# Patient Record
Sex: Male | Born: 1970 | Race: White | Hispanic: No | Marital: Single | Smoking: Current every day smoker
Health system: Southern US, Community
[De-identification: ages and names within clinical notes are randomized; demographics above are authoritative.]

## PROBLEM LIST (undated history)

## (undated) DIAGNOSIS — E669 Obesity, unspecified: Secondary | ICD-10-CM

## (undated) DIAGNOSIS — I10 Essential (primary) hypertension: Secondary | ICD-10-CM

## (undated) DIAGNOSIS — E569 Vitamin deficiency, unspecified: Secondary | ICD-10-CM

## (undated) DIAGNOSIS — G4733 Obstructive sleep apnea (adult) (pediatric): Secondary | ICD-10-CM

## (undated) HISTORY — DX: Vitamin deficiency, unspecified: E56.9

## (undated) HISTORY — DX: Obstructive sleep apnea (adult) (pediatric): G47.33

## (undated) HISTORY — DX: Obesity, unspecified: E66.9

---

## 2000-01-28 ENCOUNTER — Emergency Department (HOSPITAL_COMMUNITY): Admission: EM | Admit: 2000-01-28 | Discharge: 2000-01-28 | Payer: Self-pay | Admitting: Emergency Medicine

## 2015-10-28 HISTORY — PX: LAPAROSCOPIC GASTRIC BYPASS: SUR771

## 2017-09-25 DIAGNOSIS — G4733 Obstructive sleep apnea (adult) (pediatric): Secondary | ICD-10-CM | POA: Diagnosis not present

## 2017-10-21 DIAGNOSIS — E6609 Other obesity due to excess calories: Secondary | ICD-10-CM | POA: Diagnosis not present

## 2017-10-21 DIAGNOSIS — Z1389 Encounter for screening for other disorder: Secondary | ICD-10-CM | POA: Diagnosis not present

## 2017-10-21 DIAGNOSIS — R7309 Other abnormal glucose: Secondary | ICD-10-CM | POA: Diagnosis not present

## 2017-10-21 DIAGNOSIS — I10 Essential (primary) hypertension: Secondary | ICD-10-CM | POA: Diagnosis not present

## 2017-10-21 DIAGNOSIS — Z9884 Bariatric surgery status: Secondary | ICD-10-CM | POA: Diagnosis not present

## 2017-10-21 DIAGNOSIS — G4733 Obstructive sleep apnea (adult) (pediatric): Secondary | ICD-10-CM | POA: Diagnosis not present

## 2017-10-21 DIAGNOSIS — Z6833 Body mass index (BMI) 33.0-33.9, adult: Secondary | ICD-10-CM | POA: Diagnosis not present

## 2017-10-26 DIAGNOSIS — G4733 Obstructive sleep apnea (adult) (pediatric): Secondary | ICD-10-CM | POA: Diagnosis not present

## 2017-11-26 DIAGNOSIS — G4733 Obstructive sleep apnea (adult) (pediatric): Secondary | ICD-10-CM | POA: Diagnosis not present

## 2017-12-24 DIAGNOSIS — G4733 Obstructive sleep apnea (adult) (pediatric): Secondary | ICD-10-CM | POA: Diagnosis not present

## 2018-01-24 DIAGNOSIS — G4733 Obstructive sleep apnea (adult) (pediatric): Secondary | ICD-10-CM | POA: Diagnosis not present

## 2018-02-23 DIAGNOSIS — G4733 Obstructive sleep apnea (adult) (pediatric): Secondary | ICD-10-CM | POA: Diagnosis not present

## 2018-06-07 DIAGNOSIS — L299 Pruritus, unspecified: Secondary | ICD-10-CM | POA: Diagnosis not present

## 2018-06-07 DIAGNOSIS — Z1389 Encounter for screening for other disorder: Secondary | ICD-10-CM | POA: Diagnosis not present

## 2018-06-07 DIAGNOSIS — E6609 Other obesity due to excess calories: Secondary | ICD-10-CM | POA: Diagnosis not present

## 2018-06-07 DIAGNOSIS — Z6836 Body mass index (BMI) 36.0-36.9, adult: Secondary | ICD-10-CM | POA: Diagnosis not present

## 2018-06-07 DIAGNOSIS — R7309 Other abnormal glucose: Secondary | ICD-10-CM | POA: Diagnosis not present

## 2018-06-07 DIAGNOSIS — Z8639 Personal history of other endocrine, nutritional and metabolic disease: Secondary | ICD-10-CM | POA: Diagnosis not present

## 2018-06-07 DIAGNOSIS — R21 Rash and other nonspecific skin eruption: Secondary | ICD-10-CM | POA: Diagnosis not present

## 2018-09-06 DIAGNOSIS — Z713 Dietary counseling and surveillance: Secondary | ICD-10-CM | POA: Diagnosis not present

## 2018-09-06 DIAGNOSIS — Z6835 Body mass index (BMI) 35.0-35.9, adult: Secondary | ICD-10-CM | POA: Diagnosis not present

## 2018-09-06 DIAGNOSIS — Z9884 Bariatric surgery status: Secondary | ICD-10-CM | POA: Diagnosis not present

## 2018-09-06 DIAGNOSIS — E669 Obesity, unspecified: Secondary | ICD-10-CM | POA: Diagnosis not present

## 2018-09-14 DIAGNOSIS — G4733 Obstructive sleep apnea (adult) (pediatric): Secondary | ICD-10-CM | POA: Diagnosis not present

## 2018-09-14 DIAGNOSIS — I10 Essential (primary) hypertension: Secondary | ICD-10-CM | POA: Diagnosis not present

## 2018-09-14 DIAGNOSIS — E669 Obesity, unspecified: Secondary | ICD-10-CM | POA: Diagnosis not present

## 2018-09-14 DIAGNOSIS — Z6835 Body mass index (BMI) 35.0-35.9, adult: Secondary | ICD-10-CM | POA: Diagnosis not present

## 2018-09-21 DIAGNOSIS — R948 Abnormal results of function studies of other organs and systems: Secondary | ICD-10-CM | POA: Diagnosis not present

## 2018-09-21 DIAGNOSIS — E669 Obesity, unspecified: Secondary | ICD-10-CM | POA: Diagnosis not present

## 2018-09-21 DIAGNOSIS — Z6834 Body mass index (BMI) 34.0-34.9, adult: Secondary | ICD-10-CM | POA: Diagnosis not present

## 2018-09-27 DIAGNOSIS — J209 Acute bronchitis, unspecified: Secondary | ICD-10-CM | POA: Diagnosis not present

## 2018-09-27 DIAGNOSIS — E6609 Other obesity due to excess calories: Secondary | ICD-10-CM | POA: Diagnosis not present

## 2018-09-27 DIAGNOSIS — Z1389 Encounter for screening for other disorder: Secondary | ICD-10-CM | POA: Diagnosis not present

## 2018-09-27 DIAGNOSIS — Z6835 Body mass index (BMI) 35.0-35.9, adult: Secondary | ICD-10-CM | POA: Diagnosis not present

## 2018-09-27 DIAGNOSIS — J22 Unspecified acute lower respiratory infection: Secondary | ICD-10-CM | POA: Diagnosis not present

## 2018-11-15 DIAGNOSIS — Z9884 Bariatric surgery status: Secondary | ICD-10-CM | POA: Diagnosis not present

## 2018-11-15 DIAGNOSIS — Z1389 Encounter for screening for other disorder: Secondary | ICD-10-CM | POA: Diagnosis not present

## 2018-11-15 DIAGNOSIS — R7309 Other abnormal glucose: Secondary | ICD-10-CM | POA: Diagnosis not present

## 2018-11-15 DIAGNOSIS — G4733 Obstructive sleep apnea (adult) (pediatric): Secondary | ICD-10-CM | POA: Diagnosis not present

## 2018-11-15 DIAGNOSIS — I10 Essential (primary) hypertension: Secondary | ICD-10-CM | POA: Diagnosis not present

## 2018-11-15 DIAGNOSIS — E6609 Other obesity due to excess calories: Secondary | ICD-10-CM | POA: Diagnosis not present

## 2018-11-15 DIAGNOSIS — Z6835 Body mass index (BMI) 35.0-35.9, adult: Secondary | ICD-10-CM | POA: Diagnosis not present

## 2018-11-22 DIAGNOSIS — G4733 Obstructive sleep apnea (adult) (pediatric): Secondary | ICD-10-CM | POA: Diagnosis not present

## 2018-12-13 DIAGNOSIS — Z9884 Bariatric surgery status: Secondary | ICD-10-CM | POA: Diagnosis not present

## 2019-05-12 DIAGNOSIS — G4733 Obstructive sleep apnea (adult) (pediatric): Secondary | ICD-10-CM | POA: Diagnosis not present

## 2019-09-14 DIAGNOSIS — Z6836 Body mass index (BMI) 36.0-36.9, adult: Secondary | ICD-10-CM | POA: Diagnosis not present

## 2019-09-14 DIAGNOSIS — I10 Essential (primary) hypertension: Secondary | ICD-10-CM | POA: Diagnosis not present

## 2019-09-14 DIAGNOSIS — G4733 Obstructive sleep apnea (adult) (pediatric): Secondary | ICD-10-CM | POA: Diagnosis not present

## 2020-03-06 DIAGNOSIS — G4733 Obstructive sleep apnea (adult) (pediatric): Secondary | ICD-10-CM | POA: Diagnosis not present

## 2020-08-07 DIAGNOSIS — G4733 Obstructive sleep apnea (adult) (pediatric): Secondary | ICD-10-CM | POA: Diagnosis not present

## 2020-08-16 DIAGNOSIS — Z23 Encounter for immunization: Secondary | ICD-10-CM | POA: Diagnosis not present

## 2020-08-16 DIAGNOSIS — Z6841 Body Mass Index (BMI) 40.0 and over, adult: Secondary | ICD-10-CM | POA: Diagnosis not present

## 2020-08-16 DIAGNOSIS — G4733 Obstructive sleep apnea (adult) (pediatric): Secondary | ICD-10-CM | POA: Diagnosis not present

## 2020-08-16 DIAGNOSIS — E7849 Other hyperlipidemia: Secondary | ICD-10-CM | POA: Diagnosis not present

## 2020-08-16 DIAGNOSIS — Z9884 Bariatric surgery status: Secondary | ICD-10-CM | POA: Diagnosis not present

## 2020-08-16 DIAGNOSIS — Z0001 Encounter for general adult medical examination with abnormal findings: Secondary | ICD-10-CM | POA: Diagnosis not present

## 2020-08-16 DIAGNOSIS — Z1331 Encounter for screening for depression: Secondary | ICD-10-CM | POA: Diagnosis not present

## 2020-08-16 DIAGNOSIS — I10 Essential (primary) hypertension: Secondary | ICD-10-CM | POA: Diagnosis not present

## 2020-08-16 DIAGNOSIS — Z1389 Encounter for screening for other disorder: Secondary | ICD-10-CM | POA: Diagnosis not present

## 2020-08-16 DIAGNOSIS — R7309 Other abnormal glucose: Secondary | ICD-10-CM | POA: Diagnosis not present

## 2020-09-30 NOTE — Progress Notes (Deleted)
Cardiology Office Note:    Date:  09/30/2020   ID:  Bruce Cruz, DOB Nov 18, 1970, MRN [id number]  PCP:  No primary care provider on file.  [facility] Cardiologist:  No primary care provider on file.  [facility] Electrophysiologist:  None   Referring MD: Assunta Found, MD     History of Present Illness:    Bruce Cruz is a 49 y.o. male with a hx of morbid obesity s/p Roux-en Y, GERD and HTN who was referred by Dr. Phillips Odor for management of his hypertension.  No past medical history on file.  *** The histories are not reviewed yet. Please review them in the "History" navigator section and refresh this SmartLink.  Current Medications: No outpatient medications have been marked as taking for the 10/02/20 encounter (Appointment) with Meriam Sprague, MD.     Allergies:   Patient has no allergy information on record.   Social History   Socioeconomic History  . Marital status: Single    Spouse name: Not on file  . Number of children: Not on file  . Years of education: Not on file  . Highest education level: Not on file  Occupational History  . Not on file  Tobacco Use  . Smoking status: Not on file  Substance and Sexual Activity  . Alcohol use: Not on file  . Drug use: Not on file  . Sexual activity: Not on file  Other Topics Concern  . Not on file  Social History Narrative  . Not on file   Social Determinants of Health   Financial Resource Strain:   . Difficulty of Paying Living Expenses: Not on file  Food Insecurity:   . Worried About Programme researcher, broadcasting/film/video in the Last Year: Not on file  . Ran Out of Food in the Last Year: Not on file  Transportation Needs:   . Lack of Transportation (Medical): Not on file  . Lack of Transportation (Non-Medical): Not on file  Physical Activity:   . Days of Exercise per Week: Not on file  . Minutes of Exercise per Session: Not on file  Stress:   . Feeling of Stress : Not on file  Social Connections:   .  Frequency of Communication with Friends and Family: Not on file  . Frequency of Social Gatherings with Friends and Family: Not on file  . Attends Religious Services: Not on file  . Active Member of Clubs or Organizations: Not on file  . Attends Banker Meetings: Not on file  . Marital Status: Not on file     Family History: The patient's ***family history is not on file.  ROS:   Please see the history of present illness.    *** All other systems reviewed and are negative.  EKGs/Labs/Other Studies Reviewed:    The following studies were reviewed today: Myoview 2017: No inducible ischemia; normal LV function  EKG:  EKG is *** ordered today.  The ekg ordered today demonstrates ***  Recent Labs: No results found for requested labs within last 8760 hours.  Recent Lipid Panel No results found for: CHOL, TRIG, HDL, CHOLHDL, VLDL, LDLCALC, LDLDIRECT   Risk Assessment/Calculations:   {Does this patient have ATRIAL FIBRILLATION?:[id number]}   Physical Exam:    VS:  There were no vitals taken for this visit.    Wt Readings from Last 3 Encounters:  No data found for Wt     GEN: *** Well nourished, well developed in no acute distress  HEENT: Normal NECK: No JVD; No carotid bruits LYMPHATICS: No lymphadenopathy CARDIAC: ***RRR, no murmurs, rubs, gallops RESPIRATORY:  Clear to auscultation without rales, wheezing or rhonchi  ABDOMEN: Soft, non-tender, non-distended MUSCULOSKELETAL:  No edema; No deformity  SKIN: Warm and dry NEUROLOGIC:  Alert and oriented x 3 PSYCHIATRIC:  Normal affect   ASSESSMENT:    No diagnosis found. PLAN:    In order of problems listed above:  #HTN:  #Morbid Obesity    Shared Decision Making/Informed Consent   {Are you ordering a CV Procedure (e.g. stress test, cath, DCCV, TEE, etc)?   Press F2        :[id number]}    Medication Adjustments/Labs and Tests Ordered: Current medicines are reviewed at length with the patient today.   Concerns regarding medicines are outlined above.  No orders of the defined types were placed in this encounter.  No orders of the defined types were placed in this encounter.   There are no Patient Instructions on file for this visit.   Signed, Meriam Sprague, MD  09/30/2020 8:40 AM    [facility]

## 2020-10-02 ENCOUNTER — Ambulatory Visit: Payer: Self-pay | Admitting: Cardiology

## 2020-10-22 ENCOUNTER — Emergency Department (HOSPITAL_COMMUNITY): Payer: Worker's Compensation

## 2020-10-22 ENCOUNTER — Other Ambulatory Visit: Payer: Self-pay

## 2020-10-22 ENCOUNTER — Other Ambulatory Visit: Payer: Self-pay | Admitting: Student

## 2020-10-22 ENCOUNTER — Encounter (HOSPITAL_COMMUNITY): Payer: Self-pay

## 2020-10-22 ENCOUNTER — Emergency Department (HOSPITAL_COMMUNITY)
Admission: EM | Admit: 2020-10-22 | Discharge: 2020-10-22 | Disposition: A | Discharge status: Home / Self Care | Payer: Worker's Compensation | Attending: Emergency Medicine | Admitting: Emergency Medicine

## 2020-10-22 DIAGNOSIS — L03116 Cellulitis of left lower limb: Secondary | ICD-10-CM | POA: Diagnosis not present

## 2020-10-22 DIAGNOSIS — Z4800 Encounter for change or removal of nonsurgical wound dressing: Secondary | ICD-10-CM | POA: Diagnosis not present

## 2020-10-22 DIAGNOSIS — I10 Essential (primary) hypertension: Secondary | ICD-10-CM | POA: Insufficient documentation

## 2020-10-22 DIAGNOSIS — Z20822 Contact with and (suspected) exposure to covid-19: Secondary | ICD-10-CM | POA: Diagnosis not present

## 2020-10-22 DIAGNOSIS — L0390 Cellulitis, unspecified: Secondary | ICD-10-CM

## 2020-10-22 HISTORY — DX: Essential (primary) hypertension: I10

## 2020-10-22 LAB — COMPREHENSIVE METABOLIC PANEL
ALT: 19 U/L (ref 0–44)
AST: 19 U/L (ref 15–41)
Albumin: 3.9 g/dL (ref 3.5–5.0)
Alkaline Phosphatase: 76 U/L (ref 38–126)
Anion gap: 10 (ref 5–15)
BUN: 15 mg/dL (ref 6–20)
CO2: 24 mmol/L (ref 22–32)
Calcium: 8.8 mg/dL — ABNORMAL LOW (ref 8.9–10.3)
Chloride: 105 mmol/L (ref 98–111)
Creatinine, Ser: 1.02 mg/dL (ref 0.61–1.24)
GFR, Estimated: >60 mL/min (ref >60)
Glucose, Bld: 99 mg/dL (ref 70–99)
Potassium: 3.6 mmol/L (ref 3.5–5.1)
Sodium: 139 mmol/L (ref 135–145)
Total Bilirubin: 0.1 mg/dL — ABNORMAL LOW (ref 0.3–1.2)
Total Protein: 6.6 g/dL (ref 6.5–8.1)

## 2020-10-22 LAB — CBC WITH DIFFERENTIAL/PLATELET
Abs Immature Granulocytes: 0.03 10*3/uL (ref 0.00–0.07)
Basophils Absolute: 0.1 10*3/uL (ref 0.0–0.1)
Basophils Relative: 1 %
Eosinophils Absolute: 0.2 10*3/uL (ref 0.0–0.5)
Eosinophils Relative: 2 %
HCT: 45 % (ref 39.0–52.0)
Hemoglobin: 15.6 g/dL (ref 13.0–17.0)
Immature Granulocytes: 0 %
Lymphocytes Relative: 30 %
Lymphs Abs: 2.9 10*3/uL (ref 0.7–4.0)
MCH: 31.5 pg (ref 26.0–34.0)
MCHC: 34.7 g/dL (ref 30.0–36.0)
MCV: 90.7 fL (ref 80.0–100.0)
Monocytes Absolute: 1 10*3/uL (ref 0.1–1.0)
Monocytes Relative: 10 %
Neutro Abs: 5.5 10*3/uL (ref 1.7–7.7)
Neutrophils Relative %: 57 %
Platelets: 304 10*3/uL (ref 150–400)
RBC: 4.96 MIL/uL (ref 4.22–5.81)
RDW: 12.7 % (ref 11.5–15.5)
WBC: 9.8 10*3/uL (ref 4.0–10.5)
nRBC: 0 % (ref 0.0–0.2)

## 2020-10-22 LAB — LACTIC ACID, PLASMA: Lactic Acid, Venous: 0.9 mmol/L (ref 0.5–1.9)

## 2020-10-22 MED ORDER — DEXTROSE 5 % IV SOLN
1500.0000 mg | Freq: Once | INTRAVENOUS | Status: AC
  Administered 2020-10-22: 20:00:00 1500 mg via INTRAVENOUS
  Filled 2020-10-22: qty 75

## 2020-10-22 MED ORDER — BACITRACIN ZINC 500 UNIT/GM EX OINT
TOPICAL_OINTMENT | Freq: Once | CUTANEOUS | Status: AC
  Administered 2020-10-22: 1 via TOPICAL
  Filled 2020-10-22: qty 0.9

## 2020-10-22 NOTE — ED Provider Notes (Signed)
Medical screening examination/treatment/procedure(s) were conducted as a shared visit with non-physician practitioner(s) and myself.  I personally evaluated the patient during the encounter.    Patient is sent from [place] [facility] for further evaluation of a poorly healing wound with cellulitis.  Wound was created by a television that fell off of the wall at work.  Initially there was only superficial abrasion with some bruising.  Subsequently some blood blisters developed and it was debrided and started on antibiotics by PCP.  Initial antibiotic was Bactrim.  For 2 days it was still worsened and patient changed to clindamycin.  There has not been any improvement and there has been extension of redness.  Still no constitutional signs.  Patient is alert and appropriate.  No confusion, nontoxic, no respiratory distress.  Wound examined.  Images are in chart.  Patient does not have any SIRS criteria.  Antibiotic management after failure on clindamycin discussed with pharmacy.  Recommendations for Dalvance.  Patient is agreeable to this plan will initiate Dalvance for stable patient with planned follow-up.   Arby Barrette, MD 10/22/20 1859

## 2020-10-22 NOTE — ED Notes (Signed)
LLE wound dressed with nonadherent dressing and wrapped with Kerlix. Providers also aware of patient's BP at discharge. Patient asymptomatic and just took nightly BP meds.

## 2020-10-22 NOTE — ED Notes (Signed)
Pharmacy messaged regarding missing med dose.

## 2020-10-22 NOTE — Discharge Instructions (Signed)
You are seen today for cellulitis.  You did get a one-time dose of Dalvance, I want you to have your wound rechecked in the next 48 hours.  You can either go back to your provider from [facility]. or you can see general surgery.  If you have any new or worsening concerning symptoms such as worsening redness, fevers, vomiting please come back to the emergency department.  Please use the attached instructions.  You can stop taking your antibiotics at home.  Your blood pressure was elevated today in the emergency department, I want you to have this rechecked when you go to see your PCP, if you have any chest pain or shortness of breath or severe headache please come back to the emergency department.  Get help right away if: Your symptoms get worse. You feel very sleepy. You develop vomiting or diarrhea that persists. You notice red streaks coming from the infected area. Your red area gets larger or turns dark in color.

## 2020-10-22 NOTE — ED Provider Notes (Signed)
[facility] EMERGENCY DEPARTMENT Provider Note   CSN: [id number] Arrival date & time: 10/22/20  1415     History Chief Complaint  Patient presents with  . Wound Check    Bruce Cruz is a 49 y.o. male with past medical history of hypertension that presents emergency department today for wound check.  Patient states that on 16 December a TV fell onto the patient's lower leg while at work, has been seen by [facility]. provider Dr. Wende Crease.  Has had this area debrided, was initially prescribed Bactrim however there had been no improvement therefore his provider switched him over to clindamycin.  Patient has now been on clindamycin for 5 days, was referred to general surgery today for possible debridement.  Was seen by general surgery who recommended evaluation in the emergency department for worsening wound with possible necrosis.  Patient states that he thinks that his wound has been getting slightly worse, states that he noticed that it started getting better however for the past 2 days he has been up on his leg and the edema had increased.  Denies any numbness and tingling.  Denies any drainage.  Denies any fevers.  States that it hurts with palpation and when he walks, however no pain at rest.  Patient states that he is been able to work and walk on it, however does have discomfort when he walks.  Has been taking Tylenol which has been providing relief.  Came into the emergency department today for IV antibiotics due to patient failing outpatient antibiotics.  No other complaints at this time.  HPI     Past Medical History:  Diagnosis Date  . Hypertension     There are no problems to display for this patient.   History reviewed. No pertinent surgical history.     No family history on file.     Home Medications Prior to Admission medications   Not on File    Allergies    Patient has no allergy information on record.  Review of Systems   Review  of Systems  Constitutional: Negative for chills, diaphoresis, fatigue and fever.  HENT: Negative for congestion, sore throat and trouble swallowing.   Eyes: Negative for pain and visual disturbance.  Respiratory: Negative for cough, shortness of breath and wheezing.   Cardiovascular: Negative for chest pain, palpitations and leg swelling.  Gastrointestinal: Negative for abdominal distention, abdominal pain, diarrhea, nausea and vomiting.  Genitourinary: Negative for difficulty urinating.  Musculoskeletal: Positive for arthralgias. Negative for back pain, neck pain and neck stiffness.  Skin: Positive for color change and wound. Negative for pallor.  Neurological: Negative for dizziness, speech difficulty, weakness and headaches.  Psychiatric/Behavioral: Negative for confusion.    Physical Exam Updated Vital Signs BP (!) 197/137   Pulse 66   Temp 98.5 F (36.9 C) (Oral)   Resp 18   Ht 5\' 11"  (1.803 m)   Wt 132.9 kg   SpO2 98%   BMI 40.87 kg/m   Physical Exam Constitutional:      General: He is not in acute distress.    Appearance: Normal appearance. He is not ill-appearing, toxic-appearing or diaphoretic.  HENT:     Mouth/Throat:     Mouth: Mucous membranes are moist.     Pharynx: Oropharynx is clear.  Eyes:     General: No scleral icterus.    Extraocular Movements: Extraocular movements intact.     Pupils: Pupils are equal, round, and reactive to light.  Cardiovascular:  Rate and Rhythm: Normal rate and regular rhythm.     Pulses: Normal pulses.     Heart sounds: Normal heart sounds.  Pulmonary:     Effort: Pulmonary effort is normal. No respiratory distress.     Breath sounds: Normal breath sounds. No stridor. No wheezing, rhonchi or rales.  Chest:     Chest wall: No tenderness.  Abdominal:     General: Abdomen is flat. There is no distension.     Palpations: Abdomen is soft.     Tenderness: There is no abdominal tenderness. There is no guarding or rebound.   Musculoskeletal:        General: No swelling. Normal range of motion.     Cervical back: Normal range of motion and neck supple. No rigidity.     Right lower leg: Normal. No edema.     Left lower leg: Swelling and tenderness present. Edema present.     Comments: Patient with cellulitic area with erythema extending into left leg.  Area of 1 inch necrosis in the center of the wound.  Area is warm to touch.  Does not extend to foot.  There is also surrounding edema in the leg.  Compartments are soft.  Normal leg raise.  Denies any objective numbness.  PT pulses 2+.  Normal range of motion to lower extremity with normal strength.   Skin:    General: Skin is warm and dry.     Capillary Refill: Capillary refill takes less than 2 seconds.     Coloration: Skin is not pale.  Neurological:     General: No focal deficit present.     Mental Status: He is alert and oriented to person, place, and time.  Psychiatric:        Mood and Affect: Mood normal.        Behavior: Behavior normal.         ED Results / Procedures / Treatments   Labs (all labs ordered are listed, but only abnormal results are displayed) Labs Reviewed  COMPREHENSIVE METABOLIC PANEL - Abnormal; Notable for the following components:      Result Value   Calcium 8.8 (*)    Total Bilirubin 0.1 (*)    All other components within normal limits  SARS CORONAVIRUS 2 (TAT 6-24 HRS)  LACTIC ACID, PLASMA  CBC WITH DIFFERENTIAL/PLATELET    EKG None  Radiology DG Tibia/Fibula Left  Result Date: 10/22/2020 CLINICAL DATA:  Wound infection EXAM: LEFT TIBIA AND FIBULA - 2 VIEW COMPARISON:  None. FINDINGS: Included osseous structures are intact. There is no intrinsic osseous lesion. No cortical erosion or periosteal reaction. No soft tissue gas. IMPRESSION: No acute osseous abnormality. Electronically Signed   By: Guadlupe Spanish M.D.   On: 10/22/2020 15:51    Procedures Procedures (including critical care time)  Medications  Ordered in ED Medications  dalbavancin (DALVANCE) 1,500 mg in dextrose 5 % 500 mL IVPB (has no administration in time range)    ED Course  I have reviewed the triage vital signs and the nursing notes.  Pertinent labs & imaging results that were available during my care of the patient were reviewed by me and considered in my medical decision making (see chart for details).    MDM Rules/Calculators/A&P                          49 year old male presenting to the emergency department today for cellulitis, patient has failed outpatient antibiotics.  Is not septic, normal white count, afebrile, nontachycardic.  Normal lactic acid.  CBC and CMP unremarkable.  Plain films without any osseous abnormality, no gas noted on plain films.  Patient does not want anything for pain at this time.  Patient is distally neurovascularly intact, no streaking noted, compartments are soft.  Did speak to pharmacy, Barbara Cower who recommends Dalvance at this time.  He did come personally evaluate the patient.  Patient was observed after Dalvance infusion, patient be discharged at this time.  Patient hypertensive in the emergency department, he has not taken his nighttime dose of blood pressure medication.  Patient is asymptomatic, no concerns for hypertensive emergency at this time.  No chest pain, shortness of breath, headache, numbness or tingling, vision changes.  Did discuss that patient needs to seen by his provider for his hypertension.  Patient agreeable.  Patient will follow up with his PCP, strict return precautions given.  Doubt need for further emergent work up at this time. I explained the diagnosis and have given explicit precautions to return to the ER including for any other new or worsening symptoms. The patient understands and accepts the medical plan as it's been dictated and I have answered their questions. Discharge instructions concerning home care and prescriptions have been given. The patient is STABLE and  is discharged to home in good condition.  I discussed this case with my attending physician who cosigned this note including patient's presenting symptoms, physical exam, and planned diagnostics and interventions. Attending physician stated agreement with plan or made changes to plan which were implemented.   Attending physician assessed patient at bedside.  Final Clinical Impression(s) / ED Diagnoses Final diagnoses:  Cellulitis of left lower extremity    Rx / DC Orders ED Discharge Orders         Ordered    Ambulatory referral to Infectious Disease       Comments: Cellulitis patient:  Received dalbavancin on 10/22/2020.   10/22/20 1855           Farrel Gordon, PA-C 10/22/20 2153    Arby Barrette, MD 10/22/20 2304

## 2020-10-22 NOTE — Progress Notes (Signed)
Bruce Cruz Appointment: 10/22/2020 1:30 PM Location: [facility] Patient #: [id number] DOB: [birth year] Married / Language: English / Race: White Male   History of Present Illness  The patient is a 49 year old male who presents for wound check.On 10/11/20, a TV fell on the patient's left lower leg. Since then, he has been seen by [facility] provider, Dr. Wende Crease. On 10/16/20, a small amount of bedside debridement was performed by the provider. After taking antibiotics for 2 days, there was not much improvement, so his antibiotics were switched to Clindamycin, which he has now taken for about 5 days. He was seen again today and was referred to [place] for possible debridement.   He states the area was less swollen prior to the holidays, but due to being active the lower leg became more swollen. He denies fevers. Denies much drainage. Admits to pain with palpation, but no issues walking. States an xray was taken which was negative. No other imaging done. He does not have diabetes and is not on blood thinners. He does smoke.   Past Surgical History  Gastric Bypass  Oral Surgery   Diagnostic Studies History Colonoscopy  never  Allergies No Known Drug Allergies  [10/22/2020]: Allergies Reconciled   Medication History Olmesartan Medoxomil-HCTZ (40-25MG  Tablet, Oral) Active. dilTIAZem HCl ER Coated Beads (360MG  Capsule ER 24HR, Oral) Active. Clindamycin HCl (300MG  Capsule, Oral) Active. Medications Reconciled  Social History Alcohol use  Moderate alcohol use. Caffeine use  Carbonated beverages, Coffee. No drug use  Tobacco use  Current some day smoker.  Family History Diabetes Mellitus  Father. Heart Disease  Mother. Heart disease in male family member before age 47  Hypertension  Father, Mother.  Other Problems Back Pain  High blood pressure  Hypercholesterolemia  Sleep Apnea     Review of Systems General Not Present- Appetite  Loss, Chills, Fatigue, Fever, Night Sweats, Weight Gain and Weight Loss. Skin Present- Non-Healing Wounds. Not Present- Change in Wart/Mole, Dryness, Hives, Jaundice, New Lesions, Rash and Ulcer. HEENT Present- Nose Bleed, Ringing in the Ears, Seasonal Allergies and Wears glasses/contact lenses. Not Present- Earache, Hearing Loss, Hoarseness, Oral Ulcers, Sinus Pain, Sore Throat, Visual Disturbances and Yellow Eyes. Respiratory Present- Snoring. Not Present- Bloody sputum, Chronic Cough, Difficulty Breathing and Wheezing. Breast Not Present- Breast Mass, Breast Pain, Nipple Discharge and Skin Changes. Cardiovascular Not Present- Chest Pain, Difficulty Breathing Lying Down, Leg Cramps, Palpitations, Rapid Heart Rate, Shortness of Breath and Swelling of Extremities. Gastrointestinal Not Present- Abdominal Pain, Bloating, Bloody Stool, Change in Bowel Habits, Chronic diarrhea, Constipation, Difficulty Swallowing, Excessive gas, Gets full quickly at meals, Hemorrhoids, Indigestion, Nausea, Rectal Pain and Vomiting. Male Genitourinary Not Present- Blood in Urine, Change in Urinary Stream, Frequency, Impotence, Nocturia, Painful Urination, Urgency and Urine Leakage. Musculoskeletal Present- Joint Pain and Swelling of Extremities. Not Present- Back Pain, Joint Stiffness, Muscle Pain and Muscle Weakness. Neurological Not Present- Decreased Memory, Fainting, Headaches, Numbness, Seizures, Tingling, Tremor, Trouble walking and Weakness. Psychiatric Not Present- Anxiety, Bipolar, Change in Sleep Pattern, Depression, Fearful and Frequent crying. Endocrine Not Present- Cold Intolerance, Excessive Hunger, Hair Changes, Heat Intolerance, Hot flashes and New Diabetes. Hematology Not Present- Blood Thinners, Easy Bruising, Excessive bleeding, Gland problems, HIV and Persistent Infections.  Vitals 10/22/2020 1:31 PM Weight: 293.38 lb Height: 71in Body Surface Area: 2.48 m Body Mass Index: 40.92 kg/m  Temp.:  97.73F  Pulse: 86 (Regular)  BP: 136/76(Sitting, Left Arm, Standard)   Physical Exam GENERAL: Well-developed, well nourished male in no  acute distress Wearing mask  RESPIRATORY: Normal effort, no use of accessory muscles  MUSCULOSKELETAL: Normal gait Grossly normal ROM upper extremities Grossly normal ROM lower extremities  SKIN: Warm and dry Not diaphoretic  PSYCHIATRIC: Normal judgement and insight Normal mood and affect Alert, oriented x 3  Integumentary Note: Left lateral lower extremity: There is a 3 cm x 2 cm area of possible necrosis/scab surrounded by a fair amount of cellulitis The majority of the lateral lower extremity is swollen and tense No active drainage  **Photo was taken and placed in Epic, under Media tab**   Assessment & Plan  CELLULITIS OF LEFT LOWER LEG (L03.116) He presented to the office today per request of his Workmen's Comp provider, for possible debridement of left lateral lower extremity wound. On exam, there is no obvious abscess, but there is a fair amount of cellulitis with possible central necrosis. Dr. Freida Busman also evaluated the patient and recommended IV antibiotics, given that one week of oral antibiotics has not helped. We recommend that he go to the emergency room for further evaluation and possible imaging. He agrees this plan and will go to [facility].  **This patient encounter took 30 minutes today to perform the following: take history, perform exam, review outside records, interpret imaging, counsel the patient on their diagnosis and document encounter, findings & plan in the EHR.  Signed by Tsosie Billing, PA C (10/22/2020 2:09 PM)

## 2020-10-22 NOTE — ED Triage Notes (Signed)
Pt referred by [facility] for iv antibiotics for a left lower leg infection after dropping a TV on it on 12/16. Pt is not diabetic. Wound is red in the center and dark surrounding.

## 2020-10-23 LAB — SARS CORONAVIRUS 2 (TAT 6-24 HRS): SARS Coronavirus 2: NEGATIVE

## 2020-10-24 ENCOUNTER — Ambulatory Visit (HOSPITAL_COMMUNITY): Payer: Worker's Compensation | Attending: Preventative Medicine | Admitting: Physical Therapy

## 2020-10-24 ENCOUNTER — Other Ambulatory Visit: Payer: Self-pay

## 2020-10-24 ENCOUNTER — Ambulatory Visit (HOSPITAL_COMMUNITY): Payer: BC Managed Care – PPO | Admitting: Physical Therapy

## 2020-10-24 DIAGNOSIS — S81802D Unspecified open wound, left lower leg, subsequent encounter: Secondary | ICD-10-CM | POA: Insufficient documentation

## 2020-10-24 DIAGNOSIS — M79662 Pain in left lower leg: Secondary | ICD-10-CM | POA: Diagnosis present

## 2020-10-24 NOTE — Therapy (Signed)
[facility] [address] [place], [place], [address] Phone: [phone]   Fax:  [phone]  Wound Care Evaluation  Patient Details  Name: Bruce Cruz MRN: [id number] Date of Birth: [birth year] Referring Provider (PT): Bruce Cruz   Encounter Date: 10/24/2020   PT End of Session - 10/24/20 1547    Visit Number 1    Number of Visits 12    Date for PT Re-Evaluation 11/23/20    Authorization Type work comp    Progress Note Due on Visit 10    PT Start Time 1445    PT Stop Time 1520    PT Time Calculation (min) 35 min    Activity Tolerance Patient tolerated treatment well    Behavior During Therapy [name] for tasks assessed/performed           Past Medical History:  Diagnosis Date   Hypertension     No past surgical history on file.  There were no vitals filed for this visit.     [facility] PT Assessment - 10/24/20 0001      Assessment   Medical Diagnosis Non healing wound on Lt LE    Referring Provider (PT) Bruce Cruz    Onset Date/Surgical Date 10/11/20    Prior Therapy antibiotic, IV antibiotics, debridment at MD office      Precautions   Precaution Comments cellulitis      Restrictions   Weight Bearing Restrictions No      Balance Screen   Has the patient fallen in the past 6 months No    Has the patient had a decrease in activity level because of a fear of falling?  Yes    Is the patient reluctant to leave their home because of a fear of falling?  No      Prior Function   Level of Independence Independent           Wound Therapy - 10/24/20 0001    Subjective PT states that a TV fell off a wall and hit his on his left leg.  Initially there was just bruising but then his leg began turning red and began to swell.  He was given antibiotics but then was told to go to [facility] for IV antibiotics which he did.  He states that his leg size has come down significantly since then.,    Patient and Family Stated  Goals wound to heal    Date of Onset 10/11/20    Prior Treatments self care ,(changing dressing 2x a day, antibiotics, IV antibiotics, MD debridemetn.    Pain Scale 0-10    Pain Score 3     Pain Type Acute pain    Pain Location Leg    Pain Orientation Anterior    Pain Descriptors / Indicators Aching    Pain Onset With Activity    Patients Stated Pain Goal 0    Pain Intervention(s) Distraction    Evaluation and Treatment Procedures Explained to Patient/Family Yes    Evaluation and Treatment Procedures agreed to    Wound Properties Date First Assessed: 10/24/20 Time First Assessed: 1450 Location: Pretibial Location Orientation: Left;Proximal Wound Description (Comments): adherent eschar covering wound Present on Admission: Yes   Dressing Type Gauze (Comment)    Dressing Changed Changed    Dressing Status Dry    Dressing Change Frequency PRN    Site / Wound Assessment Dry;Dusky    % Wound base Red or Granulating 0%    %  Wound base Black/Eschar 100%    Peri-wound Assessment Edema;Purple   bruising halo around wound of 1 cm   Wound Length (cm) 3.7 cm    Wound Width (cm) 2.5 cm    Wound Depth (cm) --   unknown at this time   Wound Surface Area (cm^2) 9.25 cm^2    Closure None    Drainage Amount None    Treatment Cleansed;Debridement (Selective)    Wound Properties Date First Assessed: 10/24/20 Time First Assessed: 1459 Location: Pretibial Location Orientation: Left Wound Description (Comments): just distal to first wound Present on Admission: Yes   Dressing Type Gauze (Comment)    Dressing Changed Changed    Dressing Status Dry    Dressing Change Frequency PRN    Site / Wound Assessment Dry;Dusky    % Wound base Red or Granulating 0%    % Wound base Yellow/Fibrinous Exudate 100%    Peri-wound Assessment Edema    Wound Length (cm) 0.8 cm    Wound Width (cm) 0.4 cm    Wound Surface Area (cm^2) 0.32 cm^2    Closure None    Drainage Amount None    Treatment Cleansed;Debridement  (Selective)    Selective Debridement - Location entire wound bed hatched with scapel to allow medihoney to loosen eschar    Selective Debridement - Tools Used Forceps;Scalpel    Selective Debridement - Tissue Removed unable to remove devitalized tissue due to tissue being very adherent    Wound Therapy - Clinical Statement Bruce Cruz is a 49 yo male who has a traumatic wound which is not healing.  He has had oral and IV antibiotic and the wound has been debrided by his MD> He is currrently being referred to skilled PT for evaluation and treatment.  Bruce Cruz wound is covered with adherent eschar and will benefit from skilled PT to create an environment which is positive for wound healing.    Wound Therapy - Functional Problem List increased pain upon walking.    Factors Delaying/Impairing Wound Healing Altered sensation;Infection - systemic/local    Hydrotherapy Plan Debridement;Dressing change;Electrical stimulation    Wound Therapy - Frequency 3X / week   for 4 weeks   Wound Therapy - Current Recommendations PT    Wound Plan debridement, dressing change and education    Dressing  medihoney, xeroform 4x4, kerlix and netting to secure dressing                 Objective measurements completed on examination: See above findings.             PT Education - 10/24/20 1546    Education Details Keep dressing dry, if it becomes wet he must change the dressing    Person(s) Educated Patient    Methods Explanation    Comprehension Verbalized understanding            PT Short Term Goals - 10/24/20 1551      PT SHORT TERM GOAL #1   Title Pt pain level to decrease to no greater than a 1/10 to allow pt to be able to freely walk without increased pain.    Time 2    Period Weeks    Status New    Target Date 11/07/20      PT SHORT TERM GOAL #2   Title PT wounds to be 100% granulated to reduce risk of cellulitis.    Time 2    Period Weeks    Status New  PT  Long Term Goals - 10/24/20 1555      PT LONG TERM GOAL #1   Title Pt to have no pain in his Lt LE    Time 4    Period Weeks    Status New    Target Date 11/21/20      PT LONG TERM GOAL #2   Title PT wound to be healed.    Time 4    Period Weeks    Status New                Plan - 10/24/20 1548    Clinical Impression Statement PT to be seen for wound care to remove eschar and promote healing.  See above    Examination-Activity Limitations Hygiene/Grooming    Examination-Participation Restrictions Occupation    Stability/Clinical Decision Making Stable/Uncomplicated    Clinical Decision Making Low    Rehab Potential Good    PT Frequency 3x / week    PT Duration 4 weeks    PT Treatment/Interventions Other (comment)   debridement and dressing change; laser if needed   PT Next Visit Plan Continue with debridement until eschar has been removed then maintain a healthy environment conductive to wound healing    Consulted and Agree with Plan of Care Patient           Patient will benefit from skilled therapeutic intervention in order to improve the following deficits and impairments:  Pain,Decreased skin integrity  Visit Diagnosis: Pain in left lower leg  Open leg wound, left, subsequent encounter    Problem List There are no problems to display for this patient.  Virgina Organ, PT CLT [phone] 10/24/2020, 3:58 PM  [facility] [address] [place], [place], [address] Phone: [phone]   Fax:  [phone]  Name: Bruce Cruz MRN: [id number] Date of Birth: 02/19/1971

## 2020-10-25 ENCOUNTER — Encounter (HOSPITAL_COMMUNITY): Payer: Self-pay

## 2020-10-25 ENCOUNTER — Ambulatory Visit (HOSPITAL_COMMUNITY): Payer: Worker's Compensation | Attending: Preventative Medicine

## 2020-10-25 DIAGNOSIS — S81802D Unspecified open wound, left lower leg, subsequent encounter: Secondary | ICD-10-CM | POA: Insufficient documentation

## 2020-10-25 DIAGNOSIS — M79662 Pain in left lower leg: Secondary | ICD-10-CM | POA: Insufficient documentation

## 2020-10-25 NOTE — Therapy (Signed)
[facility] [address] [place], [place], [address] Phone: [phone]   Fax:  [phone]  Wound Care Therapy  Patient Details  Name: Bruce Cruz MRN: [id number] Date of Birth: December 19, 1970 Referring Provider (PT): Dayna Ramus   Encounter Date: 10/25/2020   PT End of Session - 10/25/20 1827    Visit Number 2    Number of Visits 12    Date for PT Re-Evaluation 11/23/20    Authorization Type work comp    Progress Note Due on Visit 10    PT Start Time 1750    PT Stop Time 1812    PT Time Calculation (min) 22 min    Activity Tolerance Patient tolerated treatment well    Behavior During Therapy [facility] for tasks assessed/performed           Past Medical History:  Diagnosis Date  . Hypertension     History reviewed. No pertinent surgical history.  There were no vitals filed for this visit.    Subjective Assessment - 10/25/20 1814    Subjective Pt reports he went to MD earlier today, received another antibiotic shot and is to continue oral.  Pain scale 3/10 throbbing pain with itching of skin perimeter.    Currently in Pain? Yes    Pain Score 3     Pain Location Leg    Pain Orientation Anterior    Pain Descriptors / Indicators Throbbing;Aching    Pain Type Acute pain                     Wound Therapy - 10/25/20 1814    Subjective Pt reports he went to MD earlier today, received another antibiotic shot and is to continue oral.  Pain scale 3/10 throbbing pain with itching of skin perimeter.    Patient and Family Stated Goals wound to heal    Date of Onset 10/11/20    Prior Treatments self care ,(changing dressing 2x a day, antibiotics, IV antibiotics, MD debridemetn.    Pain Scale 0-10    Pain Onset With Activity    Patients Stated Pain Goal 0    Pain Intervention(s) Distraction    Evaluation and Treatment Procedures Explained to Patient/Family Yes    Evaluation and Treatment Procedures agreed to    Wound  Properties Date First Assessed: 10/24/20 Time First Assessed: 1450 Location: Pretibial Location Orientation: Left;Proximal Wound Description (Comments): adherent eschar covering wound Present on Admission: Yes   Dressing Type Gauze (Comment)    Dressing Changed Changed    Dressing Status Dry    Dressing Change Frequency PRN    Site / Wound Assessment Dry;Dusky;Yellow    % Wound base Yellow/Fibrinous Exudate 15%    % Wound base Black/Eschar 85%    Peri-wound Assessment Edema;Purple   bruising halo around wound of 1 cm   Drainage Amount None    Treatment Cleansed;Debridement (Selective)    Wound Properties Date First Assessed: 10/24/20 Time First Assessed: 1459 Location: Pretibial Location Orientation: Left Wound Description (Comments): just distal to first wound Present on Admission: Yes   Dressing Type Gauze (Comment)    Dressing Changed Changed    Dressing Status Dry    Dressing Change Frequency PRN    Site / Wound Assessment Dry;Dusky    % Wound base Red or Granulating 5%    % Wound base Yellow/Fibrinous Exudate 95%    Peri-wound Assessment Edema    Drainage Amount None    Treatment Cleansed;Debridement (  Selective)    Selective Debridement - Location entire wound bed hatched with scapel to allow medihoney to loosen eschar    Selective Debridement - Tools Used Forceps;Scalpel    Selective Debridement - Tissue Removed perimeter of eschar removed with dressings, unable to remove devitalized tissue from center of wound    Wound Therapy - Clinical Statement Perimeter of eschar removed wiht dressings, unable to remove devitalized tissue from center of wound bed.  Did complete scoring to adherent slough to assist with removal,  Continued wiht medihoney in center of wound with xeroform perimeter and kerlix.  Wound does continues to have red perimeter around wound.    Wound Therapy - Functional Problem List increased pain upon walking.    Factors Delaying/Impairing Wound Healing Altered  sensation;Infection - systemic/local    Hydrotherapy Plan Debridement;Dressing change;Electrical stimulation    Wound Therapy - Frequency 3X / week   for 4 weeks   Wound Therapy - Current Recommendations PT    Wound Plan debridement, dressing change and education    Dressing  medihoney, xeroform 4x4, kerlix and netting to secure dressing                     PT Short Term Goals - 10/24/20 1551      PT SHORT TERM GOAL #1   Title Pt pain level to decrease to no greater than a 1/10 to allow pt to be able to freely walk without increased pain.    Time 2    Period Weeks    Status New    Target Date 11/07/20      PT SHORT TERM GOAL #2   Title PT wounds to be 100% granulated to reduce risk of cellulitis.    Time 2    Period Weeks    Status New             PT Long Term Goals - 10/24/20 1555      PT LONG TERM GOAL #1   Title Pt to have no pain in his Lt LE    Time 4    Period Weeks    Status New    Target Date 11/21/20      PT LONG TERM GOAL #2   Title PT wound to be healed.    Time 4    Period Weeks    Status New                  Patient will benefit from skilled therapeutic intervention in order to improve the following deficits and impairments:     Visit Diagnosis: Open leg wound, left, subsequent encounter  Pain in left lower leg     Problem List There are no problems to display for this patient.  Becky Sax, LPTA/CLT; CBIS [phone]  Juel Burrow 10/25/2020, 6:29 PM  [facility] [address] [place], [place], [address] Phone: [phone]   Fax:  [phone]  Name: MONTREY BUIST MRN: [id number] Date of Birth: 10/20/1971

## 2020-10-26 NOTE — Progress Notes (Signed)
Cardiology Office Note:    Date:  11/06/2020   ID:  Bruce Cruz, DOB [birth year], MRN [id number]  PCP:  Patient, No Pcp Per  [facility] Cardiologist:  No primary care provider on file.  [facility] Electrophysiologist:  None   Referring MD: Assunta Found, MD    History of Present Illness:    Bruce Cruz is a 49 y.o. 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 Phillips Odor for further evaluation of uncontrolled HTN.  Patient with difficult to control blood pressures at home. Currently on dilt 360mg  daily, olmesartan 40mg -HCTZ 25mg  daily.  Despite the medications, he is continuing to run 140s/80-90s. Denies any headaches, dizziness, chest pain or SOB. Patient states that he is active and able to walk a flight of stairs and walk a mile without issues. Used to go to the gym often prior to COVID with no exercise limitations. LLE swollen due to injury from TV falling on it. Usually legs do not swell. He admits that he has a lot of salt in his diet.  Has gained some weight since he has not gone to the gym regularly due to COVID.  Family history: Mother passed from MI at age 66 also had HTN, father with MI at 21 and also HTN, DMII.  Past Medical History:  Diagnosis Date  . Hypertension   . Multiple vitamin deficiency   . Obesity   . OSA (obstructive sleep apnea)     No past surgical history on file.  Current Medications: Current Meds  Medication Sig  . amLODipine (NORVASC) 10 MG tablet Take 1 tablet (10 mg total) by mouth daily.  . Biotin 10 MG CAPS Take by mouth.  . Cholecalciferol (VITAMIN D3) 25 MCG (1000 UT) CAPS Take by mouth daily.  . Ferrous Sulfate (IRON PO) Take 25 mg by mouth daily.  Oil (OMEGA-3) 500 MG CAPS Take 500 mg by mouth daily.  . Magnesium Oxide 250 MG TABS Take by mouth daily.  . Multiple Vitamin (MULTIVITAMIN) capsule Take 1 capsule by mouth daily.  46  olmesartan-hydrochlorothiazide (BENICAR HCT) 40-25 MG tablet Take 1 tablet by mouth daily.  62 spironolactone (ALDACTONE) 25 MG tablet Take 1 tablet (25 mg total) by mouth daily.  . tadalafil (CIALIS) 5 MG tablet Take 5 mg by mouth as needed for erectile dysfunction.  . Turmeric Curcumin 500 MG CAPS Take 1 capsule by mouth daily.  . [DISCONTINUED] diltiazem (CARDIZEM CD) 360 MG 24 hr capsule Take 360 mg by mouth at bedtime.     Allergies:   Patient has no known allergies.   Social History   Socioeconomic History  . Marital status: Single    Spouse name: Not on file  . Number of children: Not on file  . Years of education: Not on file  . Highest education level: Not on file  Occupational History  . Not on file  Tobacco Use  . Smoking status: Current Every Day Smoker    Types: Cigars  . Smokeless tobacco: Never Used  Substance and Sexual Activity  . Alcohol use: Yes  . Drug use: Never  . Sexual activity: Not on file  Other Topics Concern  . Not on file  Social History Narrative  . Not on file   Social Determinants of Health   Financial Resource Strain: Not on file  Food Insecurity: Not on file  Transportation Needs: Not on file  Physical Activity: Not on file  Stress: Not on file  Social Connections: Not on file     Family History: The patient's family history includes Heart attack (age of onset: 72) in his mother; Heart attack (age of onset: 12) in his father.  ROS:   Please see the history of present illness.    Review of Systems  Constitutional: Negative for chills and fever.  HENT: Negative for congestion.   Eyes: Negative for blurred vision.  Respiratory: Negative for shortness of breath.   Cardiovascular: Positive for leg swelling. Negative for chest pain, palpitations, orthopnea, claudication and PND.  Gastrointestinal: Negative for nausea and vomiting.  Genitourinary: Negative for hematuria.  Musculoskeletal: Positive for myalgias.  Neurological: Negative  for dizziness, loss of consciousness and headaches.  Endo/Heme/Allergies: Negative for polydipsia.  Psychiatric/Behavioral: Negative for substance abuse.    EKGs/Labs/Other Studies Reviewed:    The following studies were reviewed today: No cardiac studies in our system  EKG:  EKG is  ordered today.  The ekg ordered today demonstrates NSR with HR 65  Recent Labs: 10/22/2020: ALT 19; BUN 15; Creatinine, Ser 1.02; Hemoglobin 15.6; Platelets 304; Potassium 3.6; Sodium 139  Recent Lipid Panel No results found for: CHOL, TRIG, HDL, CHOLHDL, VLDL, LDLCALC, LDLDIRECT   Risk Assessment/Calculations:       Physical Exam:    VS:  BP (!) 168/110   Pulse 64   Ht 5\' 11"  (1.803 m)   Wt 294 lb (133.4 kg)   SpO2 97%   BMI 41.00 kg/m     Wt Readings from Last 3 Encounters:  11/06/20 294 lb (133.4 kg)  11/02/20 298 lb (135.2 kg)  10/22/20 293 lb (132.9 kg)     GEN:  Well nourished, well developed in no acute distress HEENT: Normal NECK: No JVD; No carotid bruits CARDIAC: RRR, no murmurs, rubs, gallops RESPIRATORY:  Clear to auscultation without rales, wheezing or rhonchi  ABDOMEN: Soft, non-tender, non-distended MUSCULOSKELETAL:  LLE wrapped and edematous from leg wound. RLE normal. SKIN: Warm and dry NEUROLOGIC:  Alert and oriented x 3 PSYCHIATRIC:  Normal affect   ASSESSMENT:    1. Uncontrolled hypertension   2. Morbid obesity (HCC)    PLAN:    In order of problems listed above:  #Resistent Hypertension: Poorly controlled despite 3 antihypertensive agents. Running 140s at home. Denies any HA, chest pain or SOB.  -Change dilt to amlodipine 10mg  daily -Continue Olmesartan 40-HCTZ 25mg  daily -Start spironolactone 25mg  daily -Repeat BMET in 1 week -Keep BP log x5 days and send in results -If poorly controlled despite above changes, will send to hypertension clinic  #Morbid Obesity with BMI 41 S/p Reux-en-Y gastric bypass. BMI 41.  -Encouarged healthy diet and exercise  as detailed below  Exercise recommendations: Goal of exercising for at least 30 minutes a day, at least 5 times per week.  Please exercise to a moderate exertion.  This means that while exercising it is difficult to speak in full sentences, however you are not so short of breath that you feel you must stop, and not so comfortable that you can carry on a full conversation.  Exertion level should be approximately a 5/10, if 10 is the most exertion you can perform.  Diet recommendations: Recommend a heart healthy diet such as the Mediterranean diet.  This diet consists of plant based foods, healthy fats, lean meats, olive oil.  It suggests limiting the intake of simple carbohydrates such as white breads, pastries, and pastas.  It also  limits the amount of red meat, wine, and dairy products such as cheese that one should consume on a daily basis.  #Risk Stratification: -LDL well controlled at 75 -Weight loss as above -HgB A1C 5.4 -Given family history and risk factors, would benefit from coronary calcium score--discussed with patient and wants to defer for now    Medication Adjustments/Labs and Tests Ordered: Current medicines are reviewed at length with the patient today.  Concerns regarding medicines are outlined above.  Orders Placed This Encounter  Procedures  . Basic metabolic panel  . EKG 12-Lead   Meds ordered this encounter  Medications  . amLODipine (NORVASC) 10 MG tablet    Sig: Take 1 tablet (10 mg total) by mouth daily.    Dispense:  90 tablet    Refill:  3  . spironolactone (ALDACTONE) 25 MG tablet    Sig: Take 1 tablet (25 mg total) by mouth daily.    Dispense:  90 tablet    Refill:  3    Patient Instructions  Medication Instructions:  Your physician has recommended you make the following change in your medication:  1.  STOP the Diltiazem 2.  START Amlodipine 10 mg taking 1 daily  3.  START Spironolactone 25 mg taking 1 daily  *If you need a refill on your cardiac  medications before your next appointment, please call your pharmacy*   Lab Work: 11/13/2020:  Come back to the office, anytime after 7:30 a.m., for BMET  If you have labs (blood work) drawn today and your tests are completely normal, you will receive your results only by: Marland Kitchen MyChart Message (if you have MyChart) OR . A paper copy in the mail If you have any lab test that is abnormal or we need to change your treatment, we will call you to review the results.   Testing/Procedures: None ordered   Follow-Up: At [facility], you and your health needs are our priority.  As part of our continuing mission to provide you with exceptional heart care, we have created designated Provider Care Teams.  These Care Teams include your primary Cardiologist (physician) and Advanced Practice Providers (APPs -  Physician Assistants and Nurse Practitioners) who all work together to provide you with the care you need, when you need it.  We recommend signing up for the patient portal called "MyChart".  Sign up information is provided on this After Visit Summary.  MyChart is used to connect with patients for Virtual Visits (Telemedicine).  Patients are able to view lab/test results, encounter notes, upcoming appointments, etc.  Non-urgent messages can be sent to your provider as well.   To learn more about what you can do with MyChart, go to [email or web address].    Your next appointment:   3 month(s)  The format for your next appointment:   In Person  Provider:   Laurance Flatten, MD   Other Instructions Monitor your blood pressure for the next 5 days, at least 3 hours after you have taken your medications,  and keep a log.  Send a mychart message to me with those readings.       Signed, Meriam Sprague, MD  11/06/2020 3:39 PM    [facility]

## 2020-10-29 ENCOUNTER — Ambulatory Visit (HOSPITAL_COMMUNITY): Payer: Worker's Compensation | Attending: Preventative Medicine | Admitting: Physical Therapy

## 2020-10-29 ENCOUNTER — Encounter (HOSPITAL_COMMUNITY): Payer: Self-pay | Admitting: Physical Therapy

## 2020-10-29 ENCOUNTER — Ambulatory Visit (HOSPITAL_COMMUNITY): Payer: BC Managed Care – PPO | Admitting: Physical Therapy

## 2020-10-29 ENCOUNTER — Other Ambulatory Visit: Payer: Self-pay

## 2020-10-29 DIAGNOSIS — M79662 Pain in left lower leg: Secondary | ICD-10-CM | POA: Diagnosis present

## 2020-10-29 DIAGNOSIS — S81802D Unspecified open wound, left lower leg, subsequent encounter: Secondary | ICD-10-CM | POA: Insufficient documentation

## 2020-10-29 NOTE — Therapy (Addendum)
[facility] [address] [place], [place], [address] Phone: [phone]   Fax:  [phone]  Wound Care Therapy  Patient Details  Name: Bruce Cruz MRN: [id number] Date of Birth: 1971-09-27 Referring Provider (PT): Dayna Ramus   Encounter Date: 10/29/2020   PT End of Session - 10/29/20 0957    Visit Number 3    Number of Visits 12    Date for PT Re-Evaluation 11/23/20    Authorization Type work comp    Progress Note Due on Visit 10    PT Start Time 0920    PT Stop Time 0945    PT Time Calculation (min) 25 min    Activity Tolerance Patient tolerated treatment well    Behavior During Therapy [facility] for tasks assessed/performed           Past Medical History:  Diagnosis Date  . Hypertension     History reviewed. No pertinent surgical history.  There were no vitals filed for this visit.               Wound Therapy - 10/29/20 0001    Subjective Patient states bandage has been sliding down his leg and only stayed on after the first session.    Patient and Family Stated Goals wound to heal    Date of Onset 10/11/20    Prior Treatments self care ,(changing dressing 2x a day, antibiotics, IV antibiotics, MD debridemetn.    Pain Scale 0-10    Pain Score 0-No pain    Evaluation and Treatment Procedures Explained to Patient/Family Yes    Evaluation and Treatment Procedures agreed to    Wound Properties Date First Assessed: 10/24/20 Time First Assessed: 1450 Location: Pretibial Location Orientation: Left;Proximal Wound Description (Comments): adherent eschar covering wound Present on Admission: Yes   Dressing Type Gauze (Comment);Impregnated gauze (petrolatum)    Dressing Changed Changed    Dressing Status Old drainage    Dressing Change Frequency PRN    Site / Wound Assessment Bleeding;Yellow    % Wound base Yellow/Fibrinous Exudate 20%    % Wound base Black/Eschar 80%    Peri-wound Assessment Purple    Drainage  Amount Minimal    Treatment Cleansed;Debridement (Selective)    Wound Properties Date First Assessed: 10/24/20 Time First Assessed: 1459 Location: Pretibial Location Orientation: Left Wound Description (Comments): just distal to first wound Present on Admission: Yes   Dressing Type Impregnated gauze (petrolatum);Gauze (Comment)    Dressing Changed Changed    Dressing Status Dry    Dressing Change Frequency PRN    Site / Wound Assessment Yellow    % Wound base Red or Granulating 80%    % Wound base Yellow/Fibrinous Exudate 20%    Peri-wound Assessment Edema    Drainage Amount None    Treatment Cleansed;Debridement (Selective)    Selective Debridement - Location entire wound bed hatched with scapel to allow medihoney to loosen eschar    Selective Debridement - Tools Used Forceps;Scalpel    Selective Debridement - Tissue Removed removed devitilized tissue from wound bed and periwound    Wound Therapy - Clinical Statement Wound with minimal drainage from lateral inferior portion of proximal wound throughout session. Proximal wound continues to be mostly adherent eschar with minimal slough. Cleansed wound and removed devitialized tissue from wound bed and periwound. Completed cross hatching on eschar and applied medihoney and xeroform to wound, kerlix, coban and netting around LLE.    Wound Therapy - Functional Problem List  increased pain upon walking.    Factors Delaying/Impairing Wound Healing Altered sensation;Infection - systemic/local    Hydrotherapy Plan Debridement;Dressing change;Electrical stimulation    Wound Therapy - Frequency 3X / week   for 4 weeks   Wound Therapy - Current Recommendations PT    Wound Plan debridement, dressing change and education    Dressing  medihoney, xeroform 4x4, kerlix and netting to secure dressing                   PT Education - 10/29/20 0956    Education Details Changing dressing, showering and cleaning wound and rewrap    Person(s) Educated  Patient    Methods Explanation    Comprehension Verbalized understanding            PT Short Term Goals - 10/24/20 1551      PT SHORT TERM GOAL #1   Title Pt pain level to decrease to no greater than a 1/10 to allow pt to be able to freely walk without increased pain.    Time 2    Period Weeks    Status New    Target Date 11/07/20      PT SHORT TERM GOAL #2   Title PT wounds to be 100% granulated to reduce risk of cellulitis.    Time 2    Period Weeks    Status New             PT Long Term Goals - 10/24/20 1555      PT LONG TERM GOAL #1   Title Pt to have no pain in his Lt LE    Time 4    Period Weeks    Status New    Target Date 11/21/20      PT LONG TERM GOAL #2   Title PT wound to be healed.    Time 4    Period Weeks    Status New                 Plan - 10/29/20 0957    Clinical Impression Statement See above    Examination-Activity Limitations Hygiene/Grooming    Examination-Participation Restrictions Occupation    Stability/Clinical Decision Making Stable/Uncomplicated    Rehab Potential Good    PT Frequency 3x / week    PT Duration 4 weeks    PT Treatment/Interventions Other (comment)   debridement and dressing change; laser if needed   PT Next Visit Plan Continue with debridement until eschar has been removed then maintain a healthy environment conductive to wound healing    Consulted and Agree with Plan of Care Patient           Patient will benefit from skilled therapeutic intervention in order to improve the following deficits and impairments:  Pain,Decreased skin integrity  Visit Diagnosis: Open leg wound, left, subsequent encounter  Pain in left lower leg     Problem List There are no problems to display for this patient.  10:12 AM, 10/29/20 Wyman Songster PT, DPT Physical Therapist at [facility]   [facility] [address] [place], [place],  [address] Phone: [phone]   Fax:  [phone]  Name: Bruce Cruz MRN: [id number] Date of Birth: Nov 03, 1970

## 2020-10-31 ENCOUNTER — Other Ambulatory Visit: Payer: Self-pay

## 2020-10-31 ENCOUNTER — Encounter (HOSPITAL_COMMUNITY): Payer: Self-pay | Admitting: Physical Therapy

## 2020-10-31 ENCOUNTER — Ambulatory Visit (HOSPITAL_COMMUNITY): Payer: Worker's Compensation | Admitting: Physical Therapy

## 2020-10-31 DIAGNOSIS — M79662 Pain in left lower leg: Secondary | ICD-10-CM

## 2020-10-31 DIAGNOSIS — S81802D Unspecified open wound, left lower leg, subsequent encounter: Secondary | ICD-10-CM | POA: Diagnosis not present

## 2020-10-31 NOTE — Therapy (Signed)
[facility] [address] [place], [place], [address] Phone: [phone]   Fax:  [phone]  Wound Care Therapy  Patient Details  Name: Bruce Cruz MRN: [id number] Date of Birth: Dec 07, 1970 Referring Provider (PT): Dayna Ramus   Encounter Date: 10/31/2020   PT End of Session - 10/31/20 1228    Visit Number 4    Number of Visits 12    Date for PT Re-Evaluation 11/23/20    Authorization Type work comp    Progress Note Due on Visit 10    PT Start Time 1046    PT Stop Time 1125    PT Time Calculation (min) 39 min    Activity Tolerance Patient tolerated treatment well    Behavior During Therapy [facility] for tasks assessed/performed           Past Medical History:  Diagnosis Date  . Hypertension     History reviewed. No pertinent surgical history.  There were no vitals filed for this visit.               Wound Therapy - 10/31/20 0001    Subjective Patient reports occasional pain. States he took the bandages off and showered last night and rewrapped it but the dressing did stay up better. States that he sees the MD tomorrow.    Patient and Family Stated Goals wound to heal    Date of Onset 10/11/20    Prior Treatments self care ,(changing dressing 2x a day, antibiotics, IV antibiotics, MD debridemetn.    Pain Scale 0-10    Pain Score 3     Pain Type Acute pain    Pain Location Leg    Pain Orientation Anterior;Left    Pain Descriptors / Indicators Throbbing    Pain Onset With Activity    Pain Intervention(s) Emotional support    Evaluation and Treatment Procedures Explained to Patient/Family Yes    Evaluation and Treatment Procedures agreed to    Wound Properties Date First Assessed: 10/24/20 Time First Assessed: 1450 Location: Pretibial Location Orientation: Left;Proximal Wound Description (Comments): adherent eschar covering wound Present on Admission: Yes   Dressing Type Gauze (Comment);Impregnated gauze  (bismuth)    Dressing Changed Changed    Dressing Status Old drainage    Dressing Change Frequency PRN    Site / Wound Assessment Bleeding;Yellow    % Wound base Yellow/Fibrinous Exudate 40%    % Wound base Black/Eschar 60%    Peri-wound Assessment Purple;Induration;Edema;Erythema (blanchable)    Wound Length (cm) 3.9 cm   was 3.7   Wound Width (cm) 2.5 cm   was 2.7   Wound Depth (cm) 0.1 cm    Wound Surface Area (cm^2) 9.75 cm^2    Drainage Amount Minimal   per report it has been draining but they changed the bandage yesterday   Drainage Description Serosanguineous    Treatment Cleansed;Debridement (Selective)    Wound Properties Date First Assessed: 10/24/20 Time First Assessed: 1459 Location: Pretibial Location Orientation: Left Wound Description (Comments): just distal to first wound Present on Admission: Yes   Dressing Type Impregnated gauze (bismuth);Gauze (Comment)    Dressing Changed Changed    Dressing Status Old drainage    Dressing Change Frequency PRN    Site / Wound Assessment Yellow;Pink   white   % Wound base Red or Granulating 30%    % Wound base Other/Granulation Tissue (Comment) 70%   white tissue   Peri-wound Assessment Edema;Induration;Erythema (blanchable)    Wound  Length (cm) 0.7 cm   was .8   Wound Width (cm) 0.4 cm   was .4   Wound Surface Area (cm^2) 0.28 cm^2    Drainage Amount Scant    Drainage Description Serosanguineous   per report it has been draining but they changed the bandage yesterday   Treatment Cleansed;Debridement (Selective)    Selective Debridement - Location entire wound bed hatched with scapel to allow medihoney to loosen eschar    Selective Debridement - Tools Used Forceps;Scalpel    Selective Debridement - Tissue Removed removed devitilized tissue from wound bed and periwound    Wound Therapy - Clinical Statement Induration and sensitivity to light tough noted around the wound. Redness continues, patient to see MD tomorrow morning prior to  next MD appointment. Continued with cross hatching and removal of slough/eschar as tolerated. Continued with medihoney and xeroform. Educated patient on how to doff bandage and when to. Changed to profore secondary to increased swelling and induration. Will continue with current POC    Wound Therapy - Functional Problem List increased pain upon walking.    Factors Delaying/Impairing Wound Healing Altered sensation;Infection - systemic/local    Hydrotherapy Plan Debridement;Dressing change;Electrical stimulation    Wound Therapy - Frequency 3X / week   for 4 weeks   Wound Therapy - Current Recommendations PT    Wound Plan debridement, dressing change and education    Dressing  medihoney, xeroform 4x4, profore and netting to secure dressing                     PT Short Term Goals - 10/24/20 1551      PT SHORT TERM GOAL #1   Title Pt pain level to decrease to no greater than a 1/10 to allow pt to be able to freely walk without increased pain.    Time 2    Period Weeks    Status New    Target Date 11/07/20      PT SHORT TERM GOAL #2   Title PT wounds to be 100% granulated to reduce risk of cellulitis.    Time 2    Period Weeks    Status New             PT Long Term Goals - 10/24/20 1555      PT LONG TERM GOAL #1   Title Pt to have no pain in his Lt LE    Time 4    Period Weeks    Status New    Target Date 11/21/20      PT LONG TERM GOAL #2   Title PT wound to be healed.    Time 4    Period Weeks    Status New                 Plan - 10/31/20 1230    Clinical Impression Statement see above    Examination-Activity Limitations Hygiene/Grooming    Examination-Participation Restrictions Occupation    Stability/Clinical Decision Making Stable/Uncomplicated    Rehab Potential Good    PT Frequency 3x / week    PT Duration 4 weeks    PT Treatment/Interventions Other (comment)   debridement and dressing change; laser if needed   PT Next Visit Plan Continue  with debridement until eschar has been removed then maintain a healthy environment conductive to wound healing    Consulted and Agree with Plan of Care Patient           Patient  will benefit from skilled therapeutic intervention in order to improve the following deficits and impairments:  Pain,Decreased skin integrity  Visit Diagnosis: Open leg wound, left, subsequent encounter  Pain in left lower leg     Problem List There are no problems to display for this patient.  12:31 PM, 10/31/20 Tereasa Coop, DPT Physical Therapy with [facility]  [phone] office  [facility] [address] [place], [place], [address] Phone: [phone]   Fax:  [phone]  Name: JAYKE CAUL MRN: [id number] Date of Birth: 05-May-1971

## 2020-11-01 ENCOUNTER — Encounter (HOSPITAL_COMMUNITY): Payer: Self-pay

## 2020-11-01 ENCOUNTER — Ambulatory Visit (HOSPITAL_COMMUNITY): Payer: BC Managed Care – PPO | Admitting: Physical Therapy

## 2020-11-01 ENCOUNTER — Ambulatory Visit (HOSPITAL_COMMUNITY): Payer: Worker's Compensation

## 2020-11-01 DIAGNOSIS — S81802D Unspecified open wound, left lower leg, subsequent encounter: Secondary | ICD-10-CM

## 2020-11-01 DIAGNOSIS — M79662 Pain in left lower leg: Secondary | ICD-10-CM

## 2020-11-01 NOTE — Therapy (Signed)
[facility] [address] [place], [place], [address] Phone: [phone]   Fax:  [phone]  Wound Care Therapy  Patient Details  Name: Bruce Cruz MRN: [id number] Date of Birth: [birth year] Referring Provider (PT): Dayna Ramus   Encounter Date: 11/01/2020   PT End of Session - 11/01/20 1222    Visit Number 5    Number of Visits 12    Date for PT Re-Evaluation 11/23/20    Authorization Type work comp    Progress Note Due on Visit 10    PT Start Time 1128    PT Stop Time 1208    PT Time Calculation (min) 40 min    Activity Tolerance Patient tolerated treatment well    Behavior During Therapy [facility] for tasks assessed/performed           Past Medical History:  Diagnosis Date  . Hypertension     History reviewed. No pertinent surgical history.  There were no vitals filed for this visit.    Subjective Assessment - 11/01/20 1210    Subjective Saw MD this morning, removed dressings and happy wiht progress.  No further antibotics as redness around wound has reduced.  No reports of pain currently, does have intermittent tingling and some itching.                     Wound Therapy - 11/01/20 0001    Subjective Saw MD this morning, removed dressings and happy wiht progress.  No further antibotics as redness around wound has reduced.  No reports of pain currently, does have intermittent tingling and some itching.    Patient and Family Stated Goals wound to heal    Date of Onset 10/11/20    Prior Treatments self care ,(changing dressing 2x a day, antibiotics, IV antibiotics, MD debridemetn.    Pain Scale 0-10    Pain Score 0-No pain    Evaluation and Treatment Procedures Explained to Patient/Family Yes    Evaluation and Treatment Procedures agreed to    Wound Properties Date First Assessed: 10/24/20 Time First Assessed: 1450 Location: Pretibial Location Orientation: Left;Proximal Wound Description (Comments): adherent  eschar covering wound Present on Admission: Yes   Dressing Type Gauze (Comment);Compression wrap   medihoney, xeroform 4x4, profore and netting to secure dressing   Dressing Changed Changed    Dressing Status Old drainage    Dressing Change Frequency PRN    Site / Wound Assessment Bleeding;Yellow;Brown    % Wound base Red or Granulating 5%    % Wound base Yellow/Fibrinous Exudate 45%    % Wound base Black/Eschar 50%    Peri-wound Assessment Induration;Erythema (blanchable)    Wound Length (cm) 3.7 cm    Wound Width (cm) 2.5 cm    Wound Depth (cm) 0.1 cm    Wound Volume (cm^3) 0.93 cm^3    Wound Surface Area (cm^2) 9.25 cm^2    Closure None    Drainage Amount Minimal   Reports drainage though profore, removed at MDs office earlier today   Drainage Description Serosanguineous    Treatment Cleansed;Debridement (Selective)    Wound Properties Date First Assessed: 10/24/20 Time First Assessed: 1459 Location: Pretibial Location Orientation: Left Wound Description (Comments): just distal to first wound Present on Admission: Yes   Dressing Type Impregnated gauze (bismuth)    Dressing Changed Changed    Dressing Status Old drainage    Dressing Change Frequency PRN    Site / Wound Assessment Yellow;Pink    %  Wound base Red or Granulating 30%    % Wound base Yellow/Fibrinous Exudate 70%    Peri-wound Assessment Erythema (blanchable)    Closure None    Drainage Amount Scant    Drainage Description Serosanguineous    Treatment Cleansed;Debridement (Selective)    Selective Debridement - Location entire wound bed hatched with scapel to allow medihoney to loosen eschar    Selective Debridement - Tools Used Forceps;Scalpel    Selective Debridement - Tissue Removed removed devitilized tissue from wound bed and periwound    Wound Therapy - Clinical Statement LE edema reduced as well as reduction in redness perimeter of wound to .5 and 1cm inferior wound.  Continued cross hatching to address adhesion  with eschar.  LE cleansed and moistured prior application of dressings: medihoney and xeroform with profore wrap for edema control.  Reports of comfort at [facility].    Wound Therapy - Functional Problem List increased pain upon walking.    Factors Delaying/Impairing Wound Healing Altered sensation;Infection - systemic/local    Hydrotherapy Plan Debridement;Dressing change;Electrical stimulation    Wound Therapy - Frequency 3X / week    Wound Therapy - Current Recommendations PT    Wound Plan debridement, dressing change and education    Dressing  medihoney, xeroform 4x4, profore and netting to secure dressing                     PT Short Term Goals - 10/24/20 1551      PT SHORT TERM GOAL #1   Title Pt pain level to decrease to no greater than a 1/10 to allow pt to be able to freely walk without increased pain.    Time 2    Period Weeks    Status New    Target Date 11/07/20      PT SHORT TERM GOAL #2   Title PT wounds to be 100% granulated to reduce risk of cellulitis.    Time 2    Period Weeks    Status New             PT Long Term Goals - 10/24/20 1555      PT LONG TERM GOAL #1   Title Pt to have no pain in his Lt LE    Time 4    Period Weeks    Status New    Target Date 11/21/20      PT LONG TERM GOAL #2   Title PT wound to be healed.    Time 4    Period Weeks    Status New                  Patient will benefit from skilled therapeutic intervention in order to improve the following deficits and impairments:     Visit Diagnosis: Pain in left lower leg  Open leg wound, left, subsequent encounter     Problem List There are no problems to display for this patient.  Becky Sax, LPTA/CLT; CBIS [phone]  Juel Burrow 11/01/2020, 12:52 PM  [facility] [address] [place], [place], [address] Phone: [phone]   Fax:  [phone]  Name: EDENILSON HOLLENKAMP MRN: [id number] Date of  Birth: [birth year]

## 2020-11-02 ENCOUNTER — Other Ambulatory Visit: Payer: Self-pay

## 2020-11-02 ENCOUNTER — Encounter: Payer: Self-pay | Admitting: Infectious Diseases

## 2020-11-02 ENCOUNTER — Ambulatory Visit (INDEPENDENT_AMBULATORY_CARE_PROVIDER_SITE_OTHER): Payer: Worker's Compensation | Admitting: Infectious Diseases

## 2020-11-02 VITALS — BP 221/131 | HR 72 | Temp 98.7°F | Wt 298.0 lb

## 2020-11-02 DIAGNOSIS — L03116 Cellulitis of left lower limb: Secondary | ICD-10-CM

## 2020-11-02 NOTE — Progress Notes (Unsigned)
[facility]                                                             [address], [place], [place], [address]                                                                  Phn. [phone]; Fax: [phone]                                                                             Date: 11/02/2020  Reason for Referral: Cellulitis   Assessment Bruce Cruz is a 50 Year old male with a PMH of HTN who is referred from ED after recent visit on 12/27 for left leg cellulitis in the setting of trauma following falling down of TV. Patient received brief course of Bactrim and  Short course of clindamycin. He is s/p one dose of Dalbavancin in the ED on 12/27 and is followed by wound care with debridements as needed.  Plan The wound in the left leg appears to be comparatively better in comparison to when he presented to the ED. No systemic symptoms No indication of antibiotics Monitor off antibiotics Follow up with wound care Follow up with me PRN Instructed to call the clinic with worsening wound/drainage, fever etc.  All questions and concerns were discussed and addressed. Patient verbalized understanding of the plan. ____________________________________________________________________________________________________________________  HPI: Bruce Cruz is a 50 Year old male with a PMH of HTN who is referred from ED after recent visit on 12/27 for left leg cellulitis. History obtained from patient as well as review of EMR.  Patient says he had a TV fell at his left leg at work on 12/16 and the following day he started having bruise.  He was seen by Worker's Comp. provider Dr. Wende Crease. he also  had this area debrided and was initially prescribed Bactrim however there had been no improvement and was switched to clindamycin. He also says he got 3 shots of injection at [facility] occupational  which he can't remember the name of. Patient was seen in the ED on 12/27 after being on Clindamycin for 5 days. He was seen by Surgery the same day OP and was told to come to the ED for further evaluation. He was given one dose of dalbavancon in the ED and sent home with follow up in the wound care.   He says he has been seeing wound care regularly and thinks his wound is healing well. He says he continued to take Clindamycin after ED visit on 12/27 and completed it 1/5. He was told to wear a compression stocking and does not want to take it off now as instructed by his wound care doctor. He showed me a  picture of his left leg wound that I have taken a picture below which looks better in comparison to his prior pictures during admission.   Denies fever, fevers, chills, sweats, ' Denies nausea/vomiting and diarrhea Denies uGU symptoms  PMH - HTN, on medications. 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   ROS: Constitutional: Negative for fever, chills, activity change, appetite change, fatigue and unexpected weight change.  HENT: Negative for congestion, sore throat, rhinorrhea, sneezing, trouble swallowing and sinus pressure.  Eyes: Negative for photophobia and visual disturbance.  Respiratory: Negative for cough, chest tightness, shortness of breath, wheezing and stridor.  Cardiovascular: Negative for chest pain, palpitations and leg swelling.  Gastrointestinal: Negative for nausea, vomiting, abdominal pain, diarrhea, constipation, blood in stool, abdominal distention and anal bleeding.  Genitourinary: Negative for dysuria, hematuria, flank pain and difficulty urinating.  Musculoskeletal: Negative for myalgias, back pain, joint swelling, arthralgias and gait problem.  Skin: Negative for color change, pallor, rash and wound.  Neurological: Negative for dizziness, tremors, weakness and  light-headedness.  Hematological: Negative for adenopathy. Does not bruise/bleed easily.  Psychiatric/Behavioral: Negative for behavioral problems, confusion, sleep disturbance, dysphoric mood, decreased concentration and agitation.   Past Medical History:  Diagnosis Date  . Hypertension    Current Outpatient Medications on File Prior to Visit  Medication Sig Dispense Refill  . Biotin 10 MG CAPS Take by mouth.    . diltiazem (CARDIZEM CD) 360 MG 24 hr capsule Take 360 mg by mouth at bedtime.    Boris Lown Oil (OMEGA-3) 500 MG CAPS Take 500 mg by mouth daily.    . magnesium citrate SOLN Take by mouth.    . Multiple Vitamin (MULTIVITAMIN) capsule Take 1 capsule by mouth daily.    Marland Kitchen olmesartan-hydrochlorothiazide (BENICAR HCT) 40-25 MG tablet Take 1 tablet by mouth daily.    . Turmeric Curcumin 500 MG CAPS Take 1 capsule by mouth daily.     No current facility-administered medications on file prior to visit.   No Known Allergies  Social History   Socioeconomic History  . Marital status: Single    Spouse name: Not on file  . Number of children: Not on file  . Years of education: Not on file  . Highest education level: Not on file  Occupational History  . Not on file  Tobacco Use  . Smoking status: Current Every Day Smoker    Types: Cigars  . Smokeless tobacco: Never Used  Substance and Sexual Activity  . Alcohol use: Yes  . Drug use: Never  . Sexual activity: Not on file  Other Topics Concern  . Not on file  Social History Narrative  . Not on file   Social Determinants of Health   Financial Resource Strain: Not on file  Food Insecurity: Not on file  Transportation Needs: Not on file  Physical Activity: Not on file  Stress: Not on file  Social Connections: Not on file  Intimate Partner Violence: Not on file   Breast Cancer-relatedfamily history is not on file.'   Vitals BP (!) 221/131   Pulse 72   Temp 98.7 F (37.1 C) (Oral)   Wt 298 lb (135.2 kg)   BMI 41.56  kg/m    Examination  General - not in acute distress, comfortably sitting in chair, MORBIDLY OBESE  HEENT - PEERLA, no pallor and no icterus Chest - b/l clear air  entry, no additional sounds CVS- Normal s1s2, RRR Abdomen - Soft, Non tender , non distended Ext- no pedal edema, COMPRESSION STOCKINGS + PATIENT REFUSED TO TAKE OFF STOCKING AS PER INSTRUCTION FROM  WOUND CARE  PICTURE OF LEFT LEG FROM 11/01/2020     Neuro: grossly normal Back - WNL Psych : calm and cooperative   Recent labs CBC Latest Ref Rng & Units 10/22/2020  WBC 4.0 - 10.5 K/uL 9.8  Hemoglobin 13.0 - 17.0 g/dL 15.6  Hematocrit 39.0 - 52.0 % 45.0  Platelets 150 - 400 K/uL 304   CMP Latest Ref Rng & Units 10/22/2020  Glucose 70 - 99 mg/dL 99  BUN 6 - 20 mg/dL 15  Creatinine 0.61 - 1.24 mg/dL 1.02  Sodium 135 - 145 mmol/L 139  Potassium 3.5 - 5.1 mmol/L 3.6  Chloride 98 - 111 mmol/L 105  CO2 22 - 32 mmol/L 24  Calcium 8.9 - 10.3 mg/dL 8.8(L)  Total Protein 6.5 - 8.1 g/dL 6.6  Total Bilirubin 0.3 - 1.2 mg/dL 0.1(L)  Alkaline Phos 38 - 126 U/L 76  AST 15 - 41 U/L 19  ALT 0 - 44 U/L 19     Pertinent Microbiology Results for orders placed or performed during the hospital encounter of 10/22/20  SARS CORONAVIRUS 2 (TAT 6-24 HRS) Nasopharyngeal Nasopharyngeal Swab     Status: None   Collection Time: 10/22/20  6:18 PM   Specimen: Nasopharyngeal Swab  Result Value Ref Range Status   SARS Coronavirus 2 NEGATIVE NEGATIVE Final    Comment: (NOTE) SARS-CoV-2 target nucleic acids are NOT DETECTED.  The SARS-CoV-2 RNA is generally detectable in upper and lower respiratory specimens during the acute phase of infection. Negative results do not preclude SARS-CoV-2 infection, do not rule out co-infections with other pathogens, and should not be used as the sole basis for treatment or other patient management decisions. Negative results must be combined with clinical observations, patient history, and  epidemiological information. The expected result is Negative.  Fact Sheet for Patients: SugarRoll.be  Fact Sheet for Healthcare Providers: https://www.woods-mathews.com/  This test is not yet approved or cleared by the [place] FDA and  has been authorized for detection and/or diagnosis of SARS-CoV-2 by FDA under an Emergency Use Authorization (EUA). This EUA will remain  in effect (meaning this test can be used) for the duration of the COVID-19 declaration under Se ction 564(b)(1) of the Act, 21 U.S.C. section 360bbb-3(b)(1), unless the authorization is terminated or revoked sooner.  Performed at [facility], [place] [address] [place], [place] [address]       Pertinent Imaging  Xray Left Tibia/Fibula 10/22/20 FINDINGS: Included osseous structures are intact. There is no intrinsic osseous lesion. No cortical erosion or periosteal reaction. No soft tissue gas.  IMPRESSION: No acute osseous abnormality.  All pertinent labs/Imagings/notes reviewed. All pertinent plain films and CT images have been personally visualized and interpreted; radiology reports have been reviewed. Decision making incorporated into the Impression / Recommendations.  I spent greater than 25 minutes with the patient including  review of prior medical records with greater than 50% of time in face to face counsel of the patient.    Electronically signed by:  Rosiland Oz, MD Infectious Disease Physician [facility] [address] [place], [place] [address] Phone: [phone]  Fax: [phone]

## 2020-11-03 DIAGNOSIS — L0390 Cellulitis, unspecified: Secondary | ICD-10-CM | POA: Insufficient documentation

## 2020-11-03 NOTE — Assessment & Plan Note (Signed)
Wound looks non infected and appears to be comparatively healing from prior pictures  Monitor off abx

## 2020-11-05 ENCOUNTER — Ambulatory Visit (HOSPITAL_COMMUNITY): Payer: Worker's Compensation | Admitting: Physical Therapy

## 2020-11-05 ENCOUNTER — Other Ambulatory Visit: Payer: Self-pay

## 2020-11-05 ENCOUNTER — Encounter (HOSPITAL_COMMUNITY): Payer: Self-pay | Admitting: Physical Therapy

## 2020-11-05 DIAGNOSIS — M79662 Pain in left lower leg: Secondary | ICD-10-CM

## 2020-11-05 DIAGNOSIS — S81802D Unspecified open wound, left lower leg, subsequent encounter: Secondary | ICD-10-CM

## 2020-11-05 NOTE — Therapy (Signed)
[facility] [address] [place], [place], [address] Phone: [phone]   Fax:  [phone]  Wound Care Therapy  Patient Details  Name: MANDY FITZWATER MRN: [id number] Date of Birth: 14-Jul-1971 Referring Provider (PT): Dayna Ramus   Encounter Date: 11/05/2020   PT End of Session - 11/05/20 1036    Visit Number 6    Number of Visits 12    Date for PT Re-Evaluation 11/23/20    Authorization Type work comp    Progress Note Due on Visit 10    PT Start Time 0950    PT Stop Time 1025    PT Time Calculation (min) 35 min    Activity Tolerance Patient tolerated treatment well    Behavior During Therapy [facility] for tasks assessed/performed           Past Medical History:  Diagnosis Date  . Hypertension     History reviewed. No pertinent surgical history.  There were no vitals filed for this visit.               Wound Therapy - 11/05/20 0001    Subjective Pt states that he has has a little more stinging sensation this weekend.  Therapsist explained that he was numb, this means his nerve endings were not working correctly, now it appears that he is getting his sensation back    Patient and Family Stated Goals wound to heal    Date of Onset 10/11/20    Prior Treatments self care ,(changing dressing 2x a day, antibiotics, IV antibiotics, MD debridemetn.    Pain Scale 0-10    Pain Score 4     Pain Type Acute pain    Pain Location Leg    Pain Orientation Anterior;Left    Pain Descriptors / Indicators Sharp    Pain Onset With Activity    Pain Intervention(s) Emotional support    Evaluation and Treatment Procedures Explained to Patient/Family Yes    Evaluation and Treatment Procedures agreed to    Wound Properties Date First Assessed: 10/24/20 Time First Assessed: 1450 Location: Pretibial Location Orientation: Left;Proximal Wound Description (Comments): adherent eschar covering wound Present on Admission: Yes   Dressing Type  Compression wrap;Gauze (Comment)    Dressing Changed Changed    Dressing Status Old drainage    Dressing Change Frequency PRN    Site / Wound Assessment Bleeding;Granulation tissue;Yellow    % Wound base Red or Granulating 25%    % Wound base Yellow/Fibrinous Exudate 75%    Peri-wound Assessment Erythema (non-blanchable);Induration    Closure None    Drainage Amount Minimal    Drainage Description Serosanguineous    Treatment Cleansed;Debridement (Selective)    Selective Debridement - Location entire wound bed    Selective Debridement - Tools Used Scalpel;Scissors;Forceps    Selective Debridement - Tissue Removed devitalized tissue, eschar and slough    Wound Therapy - Clinical Statement Wound continues to improve in granulation, edema decreased therefore trial of profore lite vs. porfore.    Wound Therapy - Functional Problem List increased pain upon walking.    Factors Delaying/Impairing Wound Healing Altered sensation;Infection - systemic/local    Hydrotherapy Plan Debridement;Dressing change;Electrical stimulation    Wound Therapy - Frequency 3X / week    Wound Therapy - Current Recommendations PT    Wound Plan debridement, dressing change and education    Dressing  medihoney, xeroform 4x4, profore lite  and netting to secure dressing  PT Short Term Goals - 10/24/20 1551      PT SHORT TERM GOAL #1   Title Pt pain level to decrease to no greater than a 1/10 to allow pt to be able to freely walk without increased pain.    Time 2    Period Weeks    Status New    Target Date 11/07/20      PT SHORT TERM GOAL #2   Title PT wounds to be 100% granulated to reduce risk of cellulitis.    Time 2    Period Weeks    Status New             PT Long Term Goals - 10/24/20 1555      PT LONG TERM GOAL #1   Title Pt to have no pain in his Lt LE    Time 4    Period Weeks    Status New    Target Date 11/21/20      PT LONG TERM GOAL #2   Title PT  wound to be healed.    Time 4    Period Weeks    Status New                 Plan - 11/05/20 1036    Clinical Impression Statement see above    Examination-Activity Limitations Hygiene/Grooming    Examination-Participation Restrictions Occupation    Stability/Clinical Decision Making Stable/Uncomplicated    Rehab Potential Good    PT Frequency 3x / week    PT Duration 4 weeks    PT Treatment/Interventions Other (comment)   debridement and dressing change; laser if needed   PT Next Visit Plan Continue with debridement until eschar has been removed then maintain a healthy environment conductive to wound healing    Consulted and Agree with Plan of Care Patient           Patient will benefit from skilled therapeutic intervention in order to improve the following deficits and impairments:  Pain,Decreased skin integrity  Visit Diagnosis: Pain in left lower leg  Open leg wound, left, subsequent encounter     Problem List Patient Active Problem List   Diagnosis Date Noted  . Cellulitis 11/03/2020   Virgina Organ, PT CLT [phone] 11/05/2020, 10:37 AM  [facility] [address] [place], [place], [address] Phone: [phone]   Fax:  [phone]  Name: DREQUAN IRONSIDE MRN: [id number] Date of Birth: 10/21/1971

## 2020-11-06 ENCOUNTER — Other Ambulatory Visit: Payer: Self-pay | Admitting: *Deleted

## 2020-11-06 ENCOUNTER — Ambulatory Visit (INDEPENDENT_AMBULATORY_CARE_PROVIDER_SITE_OTHER): Payer: BC Managed Care – PPO | Admitting: Cardiology

## 2020-11-06 VITALS — BP 168/110 | HR 64 | Ht 71.0 in | Wt 294.0 lb

## 2020-11-06 DIAGNOSIS — I10 Essential (primary) hypertension: Secondary | ICD-10-CM

## 2020-11-06 MED ORDER — SPIRONOLACTONE 25 MG PO TABS: 25.0000 mg | ORAL_TABLET | Freq: Every day | ORAL | 3 refills | Status: DC

## 2020-11-06 MED ORDER — AMLODIPINE BESYLATE 10 MG PO TABS: 10.0000 mg | ORAL_TABLET | Freq: Every day | ORAL | 3 refills | Status: DC

## 2020-11-06 NOTE — Patient Instructions (Addendum)
Medication Instructions:  Your physician has recommended you make the following change in your medication:  1.  STOP the Diltiazem 2.  START Amlodipine 10 mg taking 1 daily  3.  START Spironolactone 25 mg taking 1 daily  *If you need a refill on your cardiac medications before your next appointment, please call your pharmacy*   Lab Work: 11/13/2020:  Come back to the office, anytime after 7:30 a.m., for BMET  If you have labs (blood work) drawn today and your tests are completely normal, you will receive your results only by: Marland Kitchen MyChart Message (if you have MyChart) OR . A paper copy in the mail If you have any lab test that is abnormal or we need to change your treatment, we will call you to review the results.   Testing/Procedures: None ordered   Follow-Up: At [facility], you and your health needs are our priority.  As part of our continuing mission to provide you with exceptional heart care, we have created designated Provider Care Teams.  These Care Teams include your primary Cardiologist (physician) and Advanced Practice Providers (APPs -  Physician Assistants and Nurse Practitioners) who all work together to provide you with the care you need, when you need it.  We recommend signing up for the patient portal called "MyChart".  Sign up information is provided on this After Visit Summary.  MyChart is used to connect with patients for Virtual Visits (Telemedicine).  Patients are able to view lab/test results, encounter notes, upcoming appointments, etc.  Non-urgent messages can be sent to your provider as well.   To learn more about what you can do with MyChart, go to [email or web address].    Your next appointment:   3 month(s)  The format for your next appointment:   In Person  Provider:   Laurance Flatten, MD   Other Instructions Monitor your blood pressure for the next 5 days, at least 3 hours after you have taken your medications,  and keep a log.  Send a mychart  message to me with those readings.

## 2020-11-07 ENCOUNTER — Ambulatory Visit (HOSPITAL_COMMUNITY): Payer: Worker's Compensation | Attending: Preventative Medicine | Admitting: Physical Therapy

## 2020-11-07 ENCOUNTER — Other Ambulatory Visit: Payer: Self-pay

## 2020-11-07 DIAGNOSIS — M79662 Pain in left lower leg: Secondary | ICD-10-CM | POA: Insufficient documentation

## 2020-11-07 DIAGNOSIS — S81802D Unspecified open wound, left lower leg, subsequent encounter: Secondary | ICD-10-CM | POA: Diagnosis not present

## 2020-11-07 NOTE — Therapy (Signed)
[facility] [address] [place], [place], [address] Phone: [phone]   Fax:  [phone]  Wound Care Therapy  Patient Details  Name: Bruce Cruz MRN: [id number] Date of Birth: 08/15/1971 Referring Provider (PT): Dayna Ramus   Encounter Date: 11/07/2020    Past Medical History:  Diagnosis Date  . Hypertension   . Multiple vitamin deficiency   . Obesity   . OSA (obstructive sleep apnea)     No past surgical history on file.  There were no vitals filed for this visit.               Wound Therapy - 11/07/20 1341    Subjective states he continues to have some itching but no pain.    Patient and Family Stated Goals wound to heal    Date of Onset 10/11/20    Prior Treatments self care ,(changing dressing 2x a day, antibiotics, IV antibiotics, MD debridemetn.    Pain Scale 0-10    Pain Score 0-No pain    Evaluation and Treatment Procedures Explained to Patient/Family Yes    Evaluation and Treatment Procedures agreed to    Wound Properties Date First Assessed: 10/24/20 Time First Assessed: 1450 Location: Pretibial Location Orientation: Left;Proximal Wound Description (Comments): adherent eschar covering wound Present on Admission: Yes   Wound Image View All Images View Images    Dressing Type Impregnated gauze (bismuth);Compression wrap    Dressing Changed Changed    Dressing Status Old drainage    Dressing Change Frequency PRN    Site / Wound Assessment Red;Pink;Yellow    % Wound base Red or Granulating 60%    % Wound base Yellow/Fibrinous Exudate 40%    Peri-wound Assessment Erythema (blanchable)    Wound Length (cm) 3.7 cm    Wound Width (cm) 2.2 cm    Wound Depth (cm) 0.1 cm    Wound Volume (cm^3) 0.81 cm^3    Wound Surface Area (cm^2) 8.14 cm^2    Closure None    Drainage Amount Minimal    Drainage Description Serosanguineous    Treatment Cleansed;Debridement (Selective)    Selective Debridement -  Location entire wound bed    Selective Debridement - Tools Used Scalpel;Scissors;Forceps    Selective Debridement - Tissue Removed devitalized tissue, eschar and slough    Wound Therapy - Clinical Statement picture taken of wound today and uploaded into visit. Measurements reveal reduction in width and improvement in granulation.  Debrided edges and more yellow necrotic tissue from central wound.  Continued with medihoney in central portion of wound and xerform perimeter of this.  Moistured entire LE well following cleansing.    Wound Therapy - Functional Problem List increased pain upon walking.    Factors Delaying/Impairing Wound Healing Altered sensation;Infection - systemic/local    Hydrotherapy Plan Debridement;Dressing change;Electrical stimulation    Wound Therapy - Frequency 3X / week    Wound Therapy - Current Recommendations PT    Wound Plan debridement, dressing change and education    Dressing  medihoney, xeroform 4x4, profore lite  and netting to secure dressing                     PT Short Term Goals - 10/24/20 1551      PT SHORT TERM GOAL #1   Title Pt pain level to decrease to no greater than a 1/10 to allow pt to be able to freely walk without increased pain.    Time 2  Period Weeks    Status New    Target Date 11/07/20      PT SHORT TERM GOAL #2   Title PT wounds to be 100% granulated to reduce risk of cellulitis.    Time 2    Period Weeks    Status New             PT Long Term Goals - 10/24/20 1555      PT LONG TERM GOAL #1   Title Pt to have no pain in his Lt LE    Time 4    Period Weeks    Status New    Target Date 11/21/20      PT LONG TERM GOAL #2   Title PT wound to be healed.    Time 4    Period Weeks    Status New                  Patient will benefit from skilled therapeutic intervention in order to improve the following deficits and impairments:     Visit Diagnosis: Pain in left lower leg  Open leg wound, left,  subsequent encounter     Problem List Patient Active Problem List   Diagnosis Date Noted  . Cellulitis 11/03/2020   Lurena Nida, PTA/CLT [phone]  Lurena Nida 11/07/2020, 2:11 PM  [facility] [address] [place], [place], [address] Phone: [phone]   Fax:  [phone]  Name: Bruce Cruz MRN: [id number] Date of Birth: 1971/07/30

## 2020-11-09 ENCOUNTER — Telehealth (HOSPITAL_COMMUNITY): Payer: Self-pay | Admitting: Physical Therapy

## 2020-11-09 ENCOUNTER — Other Ambulatory Visit: Payer: Self-pay

## 2020-11-09 ENCOUNTER — Encounter (HOSPITAL_COMMUNITY): Payer: Self-pay | Admitting: Physical Therapy

## 2020-11-09 ENCOUNTER — Ambulatory Visit (HOSPITAL_COMMUNITY): Payer: Worker's Compensation

## 2020-11-09 DIAGNOSIS — S81802D Unspecified open wound, left lower leg, subsequent encounter: Secondary | ICD-10-CM

## 2020-11-09 DIAGNOSIS — M79662 Pain in left lower leg: Secondary | ICD-10-CM

## 2020-11-09 NOTE — Therapy (Signed)
[facility] [address] [place], [place], [address] Phone: [phone]   Fax:  [phone]  Wound Care Therapy  Patient Details  Name: TARICK PARENTEAU MRN: [id number] Date of Birth: 01/12/1971 Referring Provider (PT): Dayna Ramus   Encounter Date: 11/09/2020   PT End of Session - 11/09/20 1738    Visit Number 7    Number of Visits 12    Date for PT Re-Evaluation 11/23/20    Authorization Type work comp    Progress Note Due on Visit 10    PT Start Time 1450    PT Stop Time 1525    PT Time Calculation (min) 35 min    Activity Tolerance Patient tolerated treatment well    Behavior During Therapy [facility] for tasks assessed/performed           Past Medical History:  Diagnosis Date  . Hypertension   . Multiple vitamin deficiency   . Obesity   . OSA (obstructive sleep apnea)     History reviewed. No pertinent surgical history.  There were no vitals filed for this visit.    Subjective Assessment - 11/09/20 1529    Subjective Saw MD this morning, MD thinking needs surgical debridment.  No reports of pain, does have some tingling/itching.    Currently in Pain? No/denies            [facility] PT Assessment - 11/09/20 0001      Assessment   Next MD Visit 1/28                   Wound Therapy - 11/09/20 0001    Subjective Saw MD this morning, MD thinking needs surgical debridment.  No reports of pain, does have some tingling/itching.    Patient and Family Stated Goals wound to heal    Date of Onset 10/11/20    Prior Treatments self care ,(changing dressing 2x a day, antibiotics, IV antibiotics, MD debridemetn.    Pain Scale 0-10    Pain Score 0-No pain    Evaluation and Treatment Procedures Explained to Patient/Family Yes    Evaluation and Treatment Procedures agreed to    Wound Properties Date First Assessed: 10/24/20 Time First Assessed: 1450 Location: Pretibial Location Orientation: Left;Proximal Wound Description  (Comments): adherent eschar covering wound Present on Admission: Yes   Wound Image View All Images View Images    Dressing Type Impregnated gauze (bismuth)   Medihoney, xeroform, vaseline, gauze and coban   Dressing Changed Changed    Dressing Status Old drainage    Dressing Change Frequency PRN    Site / Wound Assessment Red;Pink;Yellow    % Wound base Red or Granulating 70%    % Wound base Yellow/Fibrinous Exudate 20%    % Wound base Black/Eschar 10%    Peri-wound Assessment Erythema (blanchable)    Closure None    Drainage Amount Minimal    Drainage Description Serosanguineous    Treatment Cleansed;Debridement (Selective)    Selective Debridement - Location entire wound bed    Selective Debridement - Tools Used Scalpel;Scissors;Forceps    Selective Debridement - Tissue Removed devitalized tissue, eschar and slough    Wound Therapy - Clinical Statement Wound is progressing well with improved granulation following selective debridement.  Wound was dry.  Continued with medihoney over eschar and xeroform.  No swelling noted so changed from profore to kerlix/coban.  If wound continues to be dry may need to add hydrogel next session.    Wound  Therapy - Functional Problem List increased pain upon walking.    Factors Delaying/Impairing Wound Healing Altered sensation;Infection - systemic/local    Hydrotherapy Plan Debridement;Dressing change;Electrical stimulation    Wound Therapy - Frequency 3X / week    Wound Therapy - Current Recommendations PT    Wound Plan debridement, dressing change and education    Dressing  medihoney, xeroform 4x4, profore lite  and netting to secure dressing                     PT Short Term Goals - 10/24/20 1551      PT SHORT TERM GOAL #1   Title Pt pain level to decrease to no greater than a 1/10 to allow pt to be able to freely walk without increased pain.    Time 2    Period Weeks    Status New    Target Date 11/07/20      PT SHORT TERM GOAL  #2   Title PT wounds to be 100% granulated to reduce risk of cellulitis.    Time 2    Period Weeks    Status New             PT Long Term Goals - 10/24/20 1555      PT LONG TERM GOAL #1   Title Pt to have no pain in his Lt LE    Time 4    Period Weeks    Status New    Target Date 11/21/20      PT LONG TERM GOAL #2   Title PT wound to be healed.    Time 4    Period Weeks    Status New                  Patient will benefit from skilled therapeutic intervention in order to improve the following deficits and impairments:     Visit Diagnosis: Pain in left lower leg  Open leg wound, left, subsequent encounter     Problem List Patient Active Problem List   Diagnosis Date Noted  . Cellulitis 11/03/2020   Becky Sax, LPTA/CLT; CBIS [phone]  Juel Burrow 11/09/2020, 5:45 PM  [place] [facility] [address] [place], [place], [address] Phone: [phone]   Fax:  [phone]  Name: SHAWNMICHAEL PARENTEAU MRN: [id number] Date of Birth: 1971/09/27

## 2020-11-09 NOTE — Telephone Encounter (Signed)
Sl/w pt at Friday's apptment he understands to call Monday before coming to our office.

## 2020-11-12 ENCOUNTER — Ambulatory Visit (HOSPITAL_COMMUNITY): Payer: Worker's Compensation | Admitting: Physical Therapy

## 2020-11-13 ENCOUNTER — Other Ambulatory Visit: Payer: BC Managed Care – PPO

## 2020-11-14 ENCOUNTER — Encounter (HOSPITAL_COMMUNITY): Payer: Self-pay | Admitting: Physical Therapy

## 2020-11-14 ENCOUNTER — Ambulatory Visit (HOSPITAL_COMMUNITY): Payer: Worker's Compensation | Admitting: Physical Therapy

## 2020-11-14 ENCOUNTER — Other Ambulatory Visit: Payer: Self-pay

## 2020-11-14 DIAGNOSIS — M79662 Pain in left lower leg: Secondary | ICD-10-CM | POA: Diagnosis not present

## 2020-11-14 DIAGNOSIS — S81802D Unspecified open wound, left lower leg, subsequent encounter: Secondary | ICD-10-CM

## 2020-11-14 NOTE — Therapy (Signed)
[place] [address] [place], [place], [address] Phone: [phone]   Fax:  [phone]  Wound Care Therapy  Patient Details  Name: Bruce Cruz MRN: [id number] Date of Birth: 03-Mar-1971 Referring Provider (PT): Dayna Ramus   Encounter Date: 11/14/2020   PT End of Session - 11/14/20 1047    Visit Number 8    Number of Visits 12    Date for PT Re-Evaluation 11/23/20    Authorization Type work comp - 6 visits approved initally, inquiring about additional visits - per Harriett Sine    Authorization - Visit Number 7    Authorization - Number of Visits 6    Progress Note Due on Visit 10    PT Start Time 1048    PT Stop Time 1128    PT Time Calculation (min) 40 min    Activity Tolerance Patient tolerated treatment well    Behavior During Therapy [facility] for tasks assessed/performed           Past Medical History:  Diagnosis Date   Hypertension    Multiple vitamin deficiency    Obesity    OSA (obstructive sleep apnea)     History reviewed. No pertinent surgical history.  There were no vitals filed for this visit.      [facility] PT Assessment - 11/14/20 0001      Assessment   Medical Diagnosis Non healing wound on Lt LE    Referring Provider (PT) Dayna Ramus                   Wound Therapy - 11/14/20 0001    Subjective Reports that bandage fell down at night and wife rewrapped it. States that he felt no difference in compression between profore and profore lite. States MD wants him to go to surgical center but is good with him continueing therapy until then.    Patient and Family Stated Goals wound to heal    Date of Onset 10/11/20    Prior Treatments self care ,(changing dressing 2x a day, antibiotics, IV antibiotics, MD debridemetn.    Pain Scale 0-10    Pain Score 0-No pain    Pain Type Acute pain    Evaluation and Treatment Procedures Explained to Patient/Family Yes    Evaluation and Treatment Procedures  agreed to    Wound Properties Date First Assessed: 10/24/20 Time First Assessed: 1450 Location: Pretibial Location Orientation: Left;Proximal Wound Description (Comments): adherent eschar covering wound Present on Admission: Yes   Wound Image View All Images View Images    Dressing Type Impregnated gauze (bismuth)    Dressing Changed Changed    Dressing Status Old drainage    Dressing Change Frequency PRN    Site / Wound Assessment Purple;Red;Yellow;Pink    % Wound base Red or Granulating 75%    % Wound base Yellow/Fibrinous Exudate 15%    % Wound base Black/Eschar 10%    Peri-wound Assessment Erythema (blanchable);Edema;Pink    Wound Length (cm) 3.7 cm   was 3.7   Wound Width (cm) 2.2 cm   was 2.2   Wound Depth (cm) 0.4 cm   was .1   Wound Volume (cm^3) 3.26 cm^3    Wound Surface Area (cm^2) 8.14 cm^2    Closure None    Drainage Amount Moderate    Drainage Description Serosanguineous    Treatment Cleansed;Debridement (Selective)    Selective Debridement - Location entire wound bed    Selective Debridement - Tools Used  Scalpel;Forceps    Selective Debridement - Tissue Removed devitalized tissue, eschar and slough    Wound Therapy - Clinical Statement Able to debride remaining slough at the center of wound (continued adherent slough around wound edges. Cross hatched remaining adherent slough. Wound slough at center was removed wound center looked like there was an old blood blister in wound bed. Squeezed wound edges and able to remove most of the old fluid from wound bed. Afterwards wound bed became deeper as fluid was majority of the wound bed. Will assess next time to determine if additional fluid remains in wound bed.  Continued with medihoney and xeroform. Changed to profore secondary to increased swelling but this is likely due to bandage following down and rewrapping leg with coban.    Wound Therapy - Functional Problem List increased pain upon walking.    Factors Delaying/Impairing  Wound Healing Altered sensation;Infection - systemic/local    Hydrotherapy Plan Debridement;Dressing change;Electrical stimulation    Wound Therapy - Frequency 3X / week    Wound Therapy - Current Recommendations PT    Wound Plan debridement, dressing change and education    Dressing  medihoney, xeroform 4x4, profore and netting to secure dressing                     PT Short Term Goals - 10/24/20 1551      PT SHORT TERM GOAL #1   Title Pt pain level to decrease to no greater than a 1/10 to allow pt to be able to freely walk without increased pain.    Time 2    Period Weeks    Status New    Target Date 11/07/20      PT SHORT TERM GOAL #2   Title PT wounds to be 100% granulated to reduce risk of cellulitis.    Time 2    Period Weeks    Status New             PT Long Term Goals - 10/24/20 1555      PT LONG TERM GOAL #1   Title Pt to have no pain in his Lt LE    Time 4    Period Weeks    Status New    Target Date 11/21/20      PT LONG TERM GOAL #2   Title PT wound to be healed.    Time 4    Period Weeks    Status New                 Plan - 11/14/20 1148    Clinical Impression Statement see above    Examination-Activity Limitations Hygiene/Grooming    Examination-Participation Restrictions Occupation    Stability/Clinical Decision Making Stable/Uncomplicated    Rehab Potential Good    PT Frequency 3x / week    PT Duration 4 weeks    PT Treatment/Interventions Other (comment)   debridement and dressing change; laser if needed   PT Next Visit Plan Continue with debridement until eschar has been removed then maintain a healthy environment conductive to wound healing    Consulted and Agree with Plan of Care Patient           Patient will benefit from skilled therapeutic intervention in order to improve the following deficits and impairments:  Pain,Decreased skin integrity  Visit Diagnosis: Pain in left lower leg  Open leg wound, left,  subsequent encounter     Problem List Patient Active Problem List  Diagnosis Date Noted   Cellulitis 11/03/2020   12:05 PM, 11/14/20 Tereasa Coop, DPT Physical Therapy with [facility]  [phone] office  [facility] [address] [place], [place], [address] Phone: [phone]   Fax:  [phone]  Name: ABEM SHADDIX MRN: [id number] Date of Birth: [birth year]

## 2020-11-16 ENCOUNTER — Other Ambulatory Visit: Payer: Self-pay | Admitting: Cardiology

## 2020-11-16 ENCOUNTER — Other Ambulatory Visit: Payer: Self-pay

## 2020-11-16 ENCOUNTER — Ambulatory Visit (HOSPITAL_COMMUNITY): Payer: Worker's Compensation

## 2020-11-16 ENCOUNTER — Encounter (HOSPITAL_COMMUNITY): Payer: Self-pay

## 2020-11-16 DIAGNOSIS — M79662 Pain in left lower leg: Secondary | ICD-10-CM | POA: Diagnosis not present

## 2020-11-16 DIAGNOSIS — S81802D Unspecified open wound, left lower leg, subsequent encounter: Secondary | ICD-10-CM

## 2020-11-16 NOTE — Therapy (Signed)
[facility] [facility] [address] [place], [place], [address] Phone: [phone]   Fax:  [phone]  Wound Care Therapy  Patient Details  Name: MARTYN TIMME MRN: [id number] Date of Birth: 1970/12/24 Referring Provider (PT): Dayna Ramus   Encounter Date: 11/16/2020   PT End of Session - 11/16/20 0919    Visit Number 9    Number of Visits 12    Date for PT Re-Evaluation 11/23/20    Authorization Type work comp - 6 visits approved initally, inquiring about additional visits - per Harriett Sine    Authorization - Visit Number 8    Authorization - Number of Visits 6    Progress Note Due on Visit 10    PT Start Time 0825    PT Stop Time 0905    PT Time Calculation (min) 40 min    Activity Tolerance Patient tolerated treatment well    Behavior During Therapy [facility] for tasks assessed/performed           Past Medical History:  Diagnosis Date  . Hypertension   . Multiple vitamin deficiency   . Obesity   . OSA (obstructive sleep apnea)     History reviewed. No pertinent surgical history.  There were no vitals filed for this visit.    Subjective Assessment - 11/16/20 0913    Subjective Pt reports comfort in dressings, no reports of pain.    Currently in Pain? No/denies                     Wound Therapy - 11/16/20 0001    Subjective Pt reports comfort in dressings, no reports of pain.    Patient and Family Stated Goals wound to heal    Date of Onset 10/11/20    Prior Treatments self care ,(changing dressing 2x a day, antibiotics, IV antibiotics, MD debridemetn.    Pain Scale 0-10    Pain Score 0-No pain    Evaluation and Treatment Procedures Explained to Patient/Family Yes    Evaluation and Treatment Procedures agreed to    Wound Properties Date First Assessed: 10/24/20 Time First Assessed: 1450 Location: Pretibial Location Orientation: Left;Proximal Wound Description (Comments): adherent eschar covering wound Present on  Admission: Yes   Dressing Type Impregnated gauze (bismuth);Compression wrap   medihoney, xeroform, 4x4 and profore wrap   Dressing Changed Changed    Dressing Status Old drainage    Dressing Change Frequency PRN    Site / Wound Assessment Yellow;Red;Purple    % Wound base Red or Granulating 75%    % Wound base Yellow/Fibrinous Exudate 15%    % Wound base Black/Eschar 10%    Peri-wound Assessment Erythema (blanchable)    Closure None    Drainage Amount Moderate    Drainage Description Serosanguineous    Treatment Cleansed;Debridement (Selective)    Selective Debridement - Location entire wound bed    Selective Debridement - Tools Used Scalpel;Forceps    Selective Debridement - Tissue Removed devitalized tissue, eschar and slough    Wound Therapy - Clinical Statement Crosshatching complete to address adherent slough. No additional blood from center of wound/blood blister.  Continued with profore for edema control.  No reports of pain through sessoin.    Wound Therapy - Functional Problem List increased pain upon walking.    Factors Delaying/Impairing Wound Healing Altered sensation;Infection - systemic/local    Hydrotherapy Plan Debridement;Dressing change;Electrical stimulation    Wound Therapy - Frequency 3X / week    Wound Therapy -  Current Recommendations PT    Wound Plan debridement, dressing change and education    Dressing  medihoney, xeroform 4x4, profore and netting to secure dressing                     PT Short Term Goals - 10/24/20 1551      PT SHORT TERM GOAL #1   Title Pt pain level to decrease to no greater than a 1/10 to allow pt to be able to freely walk without increased pain.    Time 2    Period Weeks    Status New    Target Date 11/07/20      PT SHORT TERM GOAL #2   Title PT wounds to be 100% granulated to reduce risk of cellulitis.    Time 2    Period Weeks    Status New             PT Long Term Goals - 10/24/20 1555      PT LONG TERM  GOAL #1   Title Pt to have no pain in his Lt LE    Time 4    Period Weeks    Status New    Target Date 11/21/20      PT LONG TERM GOAL #2   Title PT wound to be healed.    Time 4    Period Weeks    Status New                  Patient will benefit from skilled therapeutic intervention in order to improve the following deficits and impairments:     Visit Diagnosis: Open leg wound, left, subsequent encounter  Pain in left lower leg     Problem List Patient Active Problem List   Diagnosis Date Noted  . Cellulitis 11/03/2020   Becky Sax, LPTA/CLT; CBIS [phone]  Juel Burrow 11/16/2020, 12:07 PM  [facility] [address] [place], [place], [address] Phone: [phone]   Fax:  [phone]  Name: GIOVANNIE SCERBO MRN: [id number] Date of Birth: May 23, 1971

## 2020-11-17 LAB — BASIC METABOLIC PANEL WITH GFR
BUN: 25 mg/dL (ref 7–25)
CO2: 27 mmol/L (ref 20–32)
Calcium: 9.4 mg/dL (ref 8.6–10.3)
Chloride: 106 mmol/L (ref 98–110)
Creat: 0.82 mg/dL (ref 0.60–1.35)
GFR, Est African American: 122 mL/min/{1.73_m2} (ref >=60)
GFR, Est Non African American: 105 mL/min/{1.73_m2} (ref >=60)
Glucose, Bld: 94 mg/dL (ref 65–139)
Potassium: 4.2 mmol/L (ref 3.5–5.3)
Sodium: 142 mmol/L (ref 135–146)

## 2020-11-19 ENCOUNTER — Other Ambulatory Visit: Payer: Self-pay

## 2020-11-19 ENCOUNTER — Ambulatory Visit (HOSPITAL_COMMUNITY): Payer: Worker's Compensation | Admitting: Physical Therapy

## 2020-11-19 ENCOUNTER — Encounter (HOSPITAL_COMMUNITY): Payer: Self-pay | Admitting: Physical Therapy

## 2020-11-19 ENCOUNTER — Telehealth: Payer: Self-pay

## 2020-11-19 DIAGNOSIS — S81802D Unspecified open wound, left lower leg, subsequent encounter: Secondary | ICD-10-CM | POA: Diagnosis not present

## 2020-11-19 DIAGNOSIS — M79662 Pain in left lower leg: Secondary | ICD-10-CM | POA: Diagnosis not present

## 2020-11-19 MED ORDER — CARVEDILOL 3.125 MG PO TABS: 3.1250 mg | ORAL_TABLET | Freq: Two times a day (BID) | ORAL | 3 refills | Status: DC

## 2020-11-19 NOTE — Therapy (Signed)
[place], [place], [address] Phone: [phone]   Fax:  [phone]  Wound Care Therapy and Progress note  Patient Details  Name: Bruce Cruz MRN: [id number] Date of Birth: September 22, 1971 Referring Provider (PT): Junious Dresser   Progress Note Reporting Period 10/24/20 o 11/19/20 See note below for Objective Data and Assessment of Progress/Goals.       Encounter Date: 11/19/2020   PT End of Session - 11/19/20 0957    Visit Number 10    Number of Visits 12    Date for PT Re-Evaluation 11/23/20    Authorization Type work comp - 6 visits approved initally, inquiring about additional visits - per [place] - Visit Number 9    Authorization - Number of Visits 6    Progress Note Due on Visit 20    PT Start Time 0910    PT Stop Time 0950    PT Time Calculation (min) 40 min    Activity Tolerance Patient tolerated treatment well    Behavior During Therapy [facility] for tasks assessed/performed           Past Medical History:  Diagnosis Date  . Hypertension   . Multiple vitamin deficiency   . Obesity   . OSA (obstructive sleep apnea)     History reviewed. No pertinent surgical history.  There were no vitals filed for this visit.      [facility] PT Assessment - 11/19/20 0001      Assessment   Medical Diagnosis Non healing wound on Lt LE    Referring Provider (PT) Junious Dresser                   Wound Therapy - 11/19/20 0001    Subjective Patient reports no change since last session, bandage was a bit tight on top of foot, thinks there were too many layers.    Patient and Family Stated Goals wound to heal    Date of Onset 10/11/20    Prior Treatments self care ,(changing dressing 2x a day, antibiotics, IV antibiotics, MD debridemetn.    Pain Scale 0-10    Pain Score 0-No pain    Evaluation and Treatment Procedures Explained to Patient/Family Yes    Evaluation and Treatment  Procedures agreed to    Wound Properties Date First Assessed: 10/24/20 Time First Assessed: 1450 Location: Pretibial Location Orientation: Left;Proximal Wound Description (Comments): adherent eschar covering wound Present on Admission: Yes   Dressing Type Impregnated gauze (bismuth);Compression wrap   medihoney   Dressing Changed Changed    Dressing Status Old drainage    Dressing Change Frequency PRN    Site / Wound Assessment Red;Granulation tissue;Bleeding;Purple;Yellow    % Wound base Red or Granulating 80%    % Wound base Yellow/Fibrinous Exudate 15%    % Wound base Black/Eschar 5%    Peri-wound Assessment Erythema (blanchable);Edema    Wound Length (cm) 3.7 cm   was 3.7   Wound Width (cm) 2 cm   was 2.2   Wound Depth (cm) 0.4 cm   was .4   Wound Volume (cm^3) 2.96 cm^3    Wound Surface Area (cm^2) 7.4 cm^2    Undermining (cm) at 11-12 o'clock and at 2-4 o'clock    Closure None    Drainage Amount Scant    Drainage Description Serosanguineous    Treatment Cleansed;Debridement (Selective)    Selective Debridement - Location entire wound bed  Selective Debridement - Tools Used Scalpel;Forceps    Selective Debridement - Tissue Removed devitalized tissue, eschar and slough    Wound Therapy - Clinical Statement Wound with minimal change in wound dimensions but with reduced slough and necrotic tissue. Continued with medihoney and xeroform but may transition to hydrogel and xeroform in future sessions if slough/necrotic tissue continues to reduce. Increased bleeding noted today. Focused on cleaning under the undermined areas. Continued with crosshatching of slough and necrotic tissue. Transitioned to profore lite secondary to complaint of tightness at food and redness noted along dorsum of foot. Edema and scar massage to surrounding wound bed, tender to the touch but tolerated well. No goals met at this time but overall wound is healing with improved granulation tissue and reduced necrotic  tissue in wound bed.    Wound Therapy - Functional Problem List increased pain upon walking.    Factors Delaying/Impairing Wound Healing Altered sensation;Infection - systemic/local    Hydrotherapy Plan Debridement;Dressing change;Electrical stimulation    Wound Therapy - Frequency 3X / week    Wound Therapy - Current Recommendations PT    Wound Plan debridement, dressing change and education    Dressing  medihoney, xeroform 4x4, profore lite and netting to secure dressing                     PT Short Term Goals - 11/19/20 1002      PT SHORT TERM GOAL #1   Title Pt pain level to decrease to no greater than a 1/10 to allow pt to be able to freely walk without increased pain.    Time 2    Period Weeks    Status On-going    Target Date 11/07/20      PT SHORT TERM GOAL #2   Title PT wounds to be 100% granulated to reduce risk of cellulitis.    Time 2    Period Weeks    Status On-going             PT Long Term Goals - 11/19/20 1002      PT LONG TERM GOAL #1   Title Pt to have no pain in his Lt LE    Time 4    Period Weeks    Status On-going      PT LONG TERM GOAL #2   Title PT wound to be healed.    Time 4    Period Weeks    Status On-going                 Plan - 11/19/20 0958    Clinical Impression Statement see above    Examination-Activity Limitations Hygiene/Grooming    Examination-Participation Restrictions Occupation    Stability/Clinical Decision Making Stable/Uncomplicated    Rehab Potential Good    PT Frequency 3x / week    PT Duration 4 weeks    PT Treatment/Interventions Other (comment)   debridement and dressing change; laser if needed   PT Next Visit Plan Continue with debridement until eschar has been removed then maintain a healthy environment conductive to wound healing    Consulted and Agree with Plan of Care Patient           Patient will benefit from skilled therapeutic intervention in order to improve the following  deficits and impairments:  Pain,Decreased skin integrity  Visit Diagnosis: Open leg wound, left, subsequent encounter  Pain in left lower leg     Problem List Patient Active Problem List  Diagnosis Date Noted  . Cellulitis 11/03/2020   10:04 AM, 11/19/20 Jerene Pitch, DPT Physical Therapy with [facility]  [phone] office  [place] [address] [place], [place], [address] Phone: [phone]   Fax:  [phone]  Name: Bruce Cruz MRN: [id number] Date of Birth: [birth year]

## 2020-11-19 NOTE — Telephone Encounter (Signed)
The patient has been notified of the result and verbalized understanding.  All questions (if any) were answered.  Patient states he will send over his recent BP readings through mychart when he returns home. Leanord Hawking, RN 11/19/2020 9:55 AM

## 2020-11-19 NOTE — Telephone Encounter (Signed)
-----   Message from Meriam Sprague, MD sent at 11/19/2020  9:37 AM EST ----- Electrolytes and kidney function look perfect. How is his blood pressure doing at home?

## 2020-11-21 ENCOUNTER — Ambulatory Visit (HOSPITAL_COMMUNITY): Payer: Worker's Compensation | Admitting: Physical Therapy

## 2020-11-21 ENCOUNTER — Other Ambulatory Visit: Payer: Self-pay

## 2020-11-21 ENCOUNTER — Encounter (HOSPITAL_COMMUNITY): Payer: Self-pay | Admitting: Physical Therapy

## 2020-11-21 ENCOUNTER — Telehealth (HOSPITAL_COMMUNITY): Payer: Self-pay | Admitting: Physical Therapy

## 2020-11-21 DIAGNOSIS — S81802D Unspecified open wound, left lower leg, subsequent encounter: Secondary | ICD-10-CM | POA: Diagnosis not present

## 2020-11-21 DIAGNOSIS — M79662 Pain in left lower leg: Secondary | ICD-10-CM

## 2020-11-21 NOTE — Therapy (Signed)
[facility] [address] [place], [place], [address] Phone: [phone]   Fax:  [phone]  Wound Care Therapy  Patient Details  Name: Bruce Cruz MRN: [id number] Date of Birth: 1971-02-14 Referring Provider (PT): Dayna Ramus   Encounter Date: 11/21/2020   PT End of Session - 11/21/20 0956    Visit Number 11    Number of Visits 12    Date for PT Re-Evaluation 11/23/20    Authorization Type work comp - 6 visits approved initally, inquiring about additional visits - per Harriett Sine    Authorization - Visit Number 10    Authorization - Number of Visits 6    Progress Note Due on Visit 20    PT Start Time 1000    PT Stop Time 1040    PT Time Calculation (min) 40 min    Activity Tolerance Patient tolerated treatment well    Behavior During Therapy [facility] for tasks assessed/performed           Past Medical History:  Diagnosis Date  . Hypertension   . Multiple vitamin deficiency   . Obesity   . OSA (obstructive sleep apnea)     History reviewed. No pertinent surgical history.  There were no vitals filed for this visit.      [facility] PT Assessment - 11/21/20 0001      Assessment   Medical Diagnosis Non healing wound on Lt LE    Referring Provider (PT) Dayna Ramus                   Wound Therapy - 11/21/20 0001    Subjective States bandage was really comfortable. Reports not pain    Patient and Family Stated Goals wound to heal    Date of Onset 10/11/20    Prior Treatments self care ,(changing dressing 2x a day, antibiotics, IV antibiotics, MD debridemetn.    Pain Scale 0-10    Pain Score 0-No pain    Evaluation and Treatment Procedures Explained to Patient/Family Yes    Evaluation and Treatment Procedures agreed to    Wound Properties Date First Assessed: 10/24/20 Time First Assessed: 1450 Location: Pretibial Location Orientation: Left;Proximal Wound Description (Comments): adherent eschar covering wound Present  on Admission: Yes   Dressing Type Impregnated gauze (bismuth);Compression wrap   medihoney   Dressing Changed Changed    Dressing Status Old drainage    Dressing Change Frequency PRN    % Wound base Red or Granulating 85%    % Wound base Yellow/Fibrinous Exudate 15%    Peri-wound Assessment Erythema (blanchable);Edema   redness noted around entire wound 1.0 cm around - reduced with elevation   Undermining (cm) at 11 o' clock    Margins Unattached edges (unapproximated)    Closure None    Drainage Amount Scant    Drainage Description Serosanguineous    Treatment Cleansed;Debridement (Selective)    Selective Debridement - Location entire wound bed    Selective Debridement - Tools Used Scalpel;Forceps    Selective Debridement - Tissue Removed devitalized tissue, eschar and slough    Wound Therapy - Clinical Statement Wound continues to improve in granulation, another blood blister in wound bed beneath slough and accumulated blood was squeezed out wound bed. Changed to hydrogel and xeroform secondary to improve granulation tissue. Continued with profore lite secondary to patient comfort and swelling. Educated patient in signs and symptoms of systemic infection and what to do if he should suspect infection.  Opened wound  edges to help with wound approximation. Will follow up with patient next session about MD plan about continuing PT or transitioning to surgical debridement.    Wound Therapy - Functional Problem List increased pain upon walking.    Factors Delaying/Impairing Wound Healing Altered sensation;Infection - systemic/local    Hydrotherapy Plan Debridement;Dressing change;Electrical stimulation    Wound Therapy - Frequency 3X / week    Wound Therapy - Current Recommendations PT    Wound Plan debridement, dressing change and education    Dressing  hydrogel , xeroform 4x4, profore lite and netting to secure dressing                     PT Short Term Goals - 11/19/20 1002       PT SHORT TERM GOAL #1   Title Pt pain level to decrease to no greater than a 1/10 to allow pt to be able to freely walk without increased pain.    Time 2    Period Weeks    Status On-going    Target Date 11/07/20      PT SHORT TERM GOAL #2   Title PT wounds to be 100% granulated to reduce risk of cellulitis.    Time 2    Period Weeks    Status On-going             PT Long Term Goals - 11/19/20 1002      PT LONG TERM GOAL #1   Title Pt to have no pain in his Lt LE    Time 4    Period Weeks    Status On-going      PT LONG TERM GOAL #2   Title PT wound to be healed.    Time 4    Period Weeks    Status On-going                 Plan - 11/21/20 1058    Clinical Impression Statement see above    Examination-Activity Limitations Hygiene/Grooming    Examination-Participation Restrictions Occupation    Stability/Clinical Decision Making Stable/Uncomplicated    Rehab Potential Good    PT Frequency 3x / week    PT Duration 4 weeks    PT Treatment/Interventions Other (comment)   debridement and dressing change; laser if needed   PT Next Visit Plan Continue with debridement until eschar has been removed then maintain a healthy environment conductive to wound healing    Consulted and Agree with Plan of Care Patient           Patient will benefit from skilled therapeutic intervention in order to improve the following deficits and impairments:  Pain,Decreased skin integrity  Visit Diagnosis: Open leg wound, left, subsequent encounter  Pain in left lower leg     Problem List Patient Active Problem List   Diagnosis Date Noted  . Cellulitis 11/03/2020    11:12 AM, 11/21/20 Tereasa Coop, DPT Physical Therapy with [facility]  [phone] office   [facility] [address] [place], [place], [address] Phone: [phone]   Fax:  [phone]  Name: Bruce Cruz MRN: [id number] Date of  Birth: [birth year]

## 2020-11-21 NOTE — Telephone Encounter (Signed)
S/w Supervior Sherwood [facility]: [phone]  She will give auth after faxing progress notes to her [phone]. Will fax today, -Sw/ Cheri @[phone] pre-cert department.-Transferred to @ [phone]. Called Marie l/m and S/w Nyda at [facility] [phone]- she will call [facility] again. Nyda gave me another ph [phone] (Melissa). Calling now L/m 11/21/20.  #[phone]  L/M Called 11/23/20 Rep RZN#[id number] requested more visits to be approved. 11/14/2020 NF Need more approval  from WC .  WC approved 1 Eval and 6 tx (Leonarda Salon)

## 2020-11-23 ENCOUNTER — Ambulatory Visit (HOSPITAL_COMMUNITY): Payer: Worker's Compensation | Admitting: Physical Therapy

## 2020-11-23 ENCOUNTER — Other Ambulatory Visit: Payer: Self-pay

## 2020-11-23 ENCOUNTER — Encounter (HOSPITAL_COMMUNITY): Payer: Self-pay | Admitting: Physical Therapy

## 2020-11-23 DIAGNOSIS — S81802D Unspecified open wound, left lower leg, subsequent encounter: Secondary | ICD-10-CM

## 2020-11-23 DIAGNOSIS — M79662 Pain in left lower leg: Secondary | ICD-10-CM | POA: Diagnosis not present

## 2020-11-23 NOTE — Therapy (Signed)
[place], [place], [address] Phone: [phone]   Fax:  [phone]  Wound Care Therapy  Patient Details  Name: JARRY MANON MRN: [id number] Date of Birth: [birth year] Referring Provider (PT): Junious Dresser   Encounter Date: 11/23/2020   PT End of Session - 11/23/20 1400    Visit Number 12    Number of Visits 18    Date for PT Re-Evaluation 12/06/20    Authorization Type work comp - 6 visits approved initally, inquiring about additional visits - per [place] - Visit Number 12    Authorization - Number of Visits 6    Progress Note Due on Visit 18    PT Start Time 1315    PT Stop Time 1350    PT Time Calculation (min) 35 min    Activity Tolerance Patient tolerated treatment well    Behavior During Therapy [facility] for tasks assessed/performed           Past Medical History:  Diagnosis Date  . Hypertension   . Multiple vitamin deficiency   . Obesity   . OSA (obstructive sleep apnea)     History reviewed. No pertinent surgical history.  There were no vitals filed for this visit.      [facility] PT Assessment - 11/23/20 0001      Assessment   Medical Diagnosis Non healing wound on Lt LE    Referring Provider (PT) Junious Dresser    Onset Date/Surgical Date 10/11/20    Prior Therapy antibiotic, IV antibiotics, debridment at MD office      Precautions   Precaution Comments cellulitis      Restrictions   Weight Bearing Restrictions No      Prior Function   Level of Independence Independent                   Wound Therapy - 11/23/20 0001    Subjective Pt has no complaints states occasionally the wound itches.  States the MD has discharged him from his care and he is to continue care here.    Patient and Family Stated Goals wound to heal    Date of Onset 10/11/20    Prior Treatments self care ,(changing dressing 2x a day, antibiotics, IV antibiotics, MD debridemetn.    Pain  Score 0-No pain    Evaluation and Treatment Procedures Explained to Patient/Family Yes    Evaluation and Treatment Procedures agreed to    Wound Properties Date First Assessed: 10/24/20 Time First Assessed: 1450 Location: Pretibial Location Orientation: Left;Proximal Wound Description (Comments): adherent eschar covering wound Present on Admission: Yes   Dressing Type Gauze (Comment)    Dressing Changed Changed    Dressing Status Old drainage    Dressing Change Frequency PRN    % Wound base Red or Granulating 90%    % Wound base Yellow/Fibrinous Exudate 10%    Peri-wound Assessment Erythema (blanchable);Edema    Margins Epibole (rolled edges)    Closure None    Drainage Amount Scant    Drainage Description Sanguineous    Treatment Cleansed;Debridement (Selective)   manual to decrease edema   Selective Debridement - Location wound bed and epiboled edges    Selective Debridement - Tools Used Forceps    Selective Debridement - Tissue Removed devitalized tissue    Wound Therapy - Clinical Statement Wound continues to improve in granulation and base of wound continues to elevate.  Wound bed slightly dry  due to areas needing debridement being tender today therapist dressed using medihoney and xeroform followed by profore lite.    Factors Delaying/Impairing Wound Healing Altered sensation    Hydrotherapy Plan Debridement;Dressing change;Patient/family education    Wound Therapy - Frequency 3X / week    Wound Therapy - Current Recommendations PT    Wound Plan debridement and dresing change    Dressing  medihoney, xerofrm profore lite                     PT Short Term Goals - 11/23/20 1403      PT SHORT TERM GOAL #1   Title Pt pain level to decrease to no greater than a 1/10 to allow pt to be able to freely walk without increased pain.    Time 2    Period Weeks    Status Achieved    Target Date 11/07/20      PT SHORT TERM GOAL #2   Title PT wounds to be 100% granulated to  reduce risk of cellulitis.    Time 2    Period Weeks    Status Partially Met             PT Long Term Goals - 11/23/20 1403      PT LONG TERM GOAL #1   Title Pt to have no pain in his Lt LE    Time 4    Period Weeks    Status Achieved      PT LONG TERM GOAL #2   Title PT wound to be healed.    Time 4    Period Weeks    Status Partially Met                 Plan - 11/23/20 1402    Clinical Impression Statement see above    Examination-Activity Limitations Hygiene/Grooming    Examination-Participation Restrictions Occupation    Stability/Clinical Decision Making Stable/Uncomplicated    Clinical Decision Making Low    Rehab Potential Good    PT Frequency 3x / week    PT Duration --   to continue for 2 more weeks for a total of 6 weeks   PT Treatment/Interventions Other (comment)    PT Next Visit Plan Continue with debridement until eschar has been removed then maintain a healthy environment conductive to wound healing    Consulted and Agree with Plan of Care Patient           Patient will benefit from skilled therapeutic intervention in order to improve the following deficits and impairments:  Pain,Decreased skin integrity,Increased edema  Visit Diagnosis: Open leg wound, left, subsequent encounter  Pain in left lower leg     Problem List Patient Active Problem List   Diagnosis Date Noted  . Cellulitis 11/03/2020  Rayetta Humphrey, PT CLT [phone] 11/23/2020, 2:03 PM  [place] [address] [place], [place], [address] Phone: [phone]   Fax:  [phone]  Name: DEREKE NEUMANN MRN: [id number] Date of Birth: [birth year]

## 2020-11-26 ENCOUNTER — Encounter (HOSPITAL_COMMUNITY): Payer: Self-pay | Admitting: Physical Therapy

## 2020-11-26 ENCOUNTER — Ambulatory Visit (HOSPITAL_COMMUNITY): Payer: Worker's Compensation | Admitting: Physical Therapy

## 2020-11-26 ENCOUNTER — Other Ambulatory Visit: Payer: Self-pay

## 2020-11-26 DIAGNOSIS — M79662 Pain in left lower leg: Secondary | ICD-10-CM | POA: Diagnosis not present

## 2020-11-26 DIAGNOSIS — S81802D Unspecified open wound, left lower leg, subsequent encounter: Secondary | ICD-10-CM

## 2020-11-26 NOTE — Therapy (Addendum)
[place], [place], [address] Phone: [phone]   Fax:  [phone]  Wound Care Therapy  Patient Details  Name: Bruce Cruz MRN: [id number] Date of Birth: 04/20/1971 Referring Provider (PT): Junious Dresser   Encounter Date: 11/26/2020   PT End of Session - 11/26/20 0923    Visit Number 13    Number of Visits 18    Date for PT Re-Evaluation 12/06/20    Authorization Type work comp - 6 visits approved initally, inquiring about additional visits - per Izora Gala    Authorization - Visit Number 13    Authorization - Number of Visits 6    Progress Note Due on Visit 18    PT Start Time 0830    PT Stop Time 0917    PT Time Calculation (min) 47 min    Activity Tolerance Patient tolerated treatment well    Behavior During Therapy [facility] for tasks assessed/performed           Past Medical History:  Diagnosis Date  . Hypertension   . Multiple vitamin deficiency   . Obesity   . OSA (obstructive sleep apnea)     History reviewed. No pertinent surgical history.  There were no vitals filed for this visit.               Wound Therapy - 11/26/20 0001    Subjective Patient reports no pain and no issues with dressing    Patient and Family Stated Goals wound to heal    Date of Onset 10/11/20    Prior Treatments self care ,(changing dressing 2x a day, antibiotics, IV antibiotics, MD debridemetn.    Pain Scale 0-10    Pain Score 0-No pain    Evaluation and Treatment Procedures Explained to Patient/Family Yes    Evaluation and Treatment Procedures agreed to    Wound Properties Date First Assessed: 10/24/20 Time First Assessed: 1450 Location: Pretibial Location Orientation: Left;Proximal Wound Description (Comments): adherent eschar covering wound Present on Admission: Yes   Dressing Type Impregnated gauze (petrolatum);Compression wrap   medihoney   Dressing Changed Changed    Dressing Status Old drainage     Dressing Change Frequency PRN    Site / Wound Assessment Bleeding;Granulation tissue;Yellow;Red    % Wound base Red or Granulating 90%    % Wound base Yellow/Fibrinous Exudate 10%    Peri-wound Assessment Erythema (blanchable);Edema    Wound Length (cm) 3 cm   was 3.7   Wound Width (cm) 2 cm   was 2.2, 2 last week   Wound Depth (cm) 0.8 cm   was.4   Wound Volume (cm^3) 4.8 cm^3    Wound Surface Area (cm^2) 6 cm^2    Tunneling (cm) .3cm  at 11 o'clock    Undermining (cm) from 10 to 6 o'clock    Margins Epibole (rolled edges)    Closure None    Drainage Amount Scant    Drainage Description Serosanguineous    Treatment Cleansed;Debridement (Selective)    Selective Debridement - Location wound bed and epiboled edges    Selective Debridement - Tools Used Forceps;Scalpel    Selective Debridement - Tissue Removed devitalized tissue    Wound Therapy - Clinical Statement Wound with minimal drainage on bandages. Cleansed and debrided wound bed and edges with bleeding around edges noted. Wound bed increasing in depth noted following debridment of slough, check depth next session as patient may need antibiotics. Wound length decreasing but width remains  the same. Patient with tunnel at 11 o'clock and undermining from 10-6 o'clock. Applied medihoney to wound bed with vasoline to periwound area. Applied xeroform with hydrogel on top to moisten wound as it has been drying between sessions. 4x4 to cover followed by profore lite for continued edema.    Factors Delaying/Impairing Wound Healing Altered sensation    Hydrotherapy Plan Debridement;Dressing change;Patient/family education    Wound Therapy - Frequency 3X / week    Wound Therapy - Current Recommendations PT    Wound Plan debridement and dressing change, check depth to see if deepening for possible need for antibiotics, measure undermining    Dressing  medihoney, xerofrm, hydrogel, 4x4, profore lite                     PT Short Term  Goals - 11/23/20 1403      PT SHORT TERM GOAL #1   Title Pt pain level to decrease to no greater than a 1/10 to allow pt to be able to freely walk without increased pain.    Time 2    Period Weeks    Status Achieved    Target Date 11/07/20      PT SHORT TERM GOAL #2   Title PT wounds to be 100% granulated to reduce risk of cellulitis.    Time 2    Period Weeks    Status Partially Met             PT Long Term Goals - 11/23/20 1403      PT LONG TERM GOAL #1   Title Pt to have no pain in his Lt LE    Time 4    Period Weeks    Status Achieved      PT LONG TERM GOAL #2   Title PT wound to be healed.    Time 4    Period Weeks    Status Partially Met                 Plan - 11/26/20 0924    Clinical Impression Statement see above    Examination-Activity Limitations Hygiene/Grooming    Examination-Participation Restrictions Occupation    Stability/Clinical Decision Making Stable/Uncomplicated    Rehab Potential Good    PT Frequency 3x / week    PT Duration --   to continue for 2 more weeks for a total of 6 weeks   PT Treatment/Interventions Other (comment)    PT Next Visit Plan Continue with debridement until eschar has been removed then maintain a healthy environment conductive to wound healing    Consulted and Agree with Plan of Care Patient           Patient will benefit from skilled therapeutic intervention in order to improve the following deficits and impairments:  Pain,Decreased skin integrity,Increased edema  Visit Diagnosis: Open leg wound, left, subsequent encounter  Pain in left lower leg     Problem List Patient Active Problem List   Diagnosis Date Noted  . Cellulitis 11/03/2020    10:41 AM, 11/26/20 Mearl Latin PT, DPT Physical Therapist at [place], [place], [address] Phone: [phone]   Fax:  [phone]  Name:  Bruce Cruz MRN: [id number] Date of Birth: Feb 11, 1971

## 2020-11-28 ENCOUNTER — Encounter (HOSPITAL_COMMUNITY): Payer: Self-pay | Admitting: Physical Therapy

## 2020-11-28 ENCOUNTER — Ambulatory Visit (HOSPITAL_COMMUNITY): Payer: Worker's Compensation | Attending: Preventative Medicine | Admitting: Physical Therapy

## 2020-11-28 ENCOUNTER — Other Ambulatory Visit: Payer: Self-pay

## 2020-11-28 DIAGNOSIS — M79662 Pain in left lower leg: Secondary | ICD-10-CM | POA: Insufficient documentation

## 2020-11-28 DIAGNOSIS — S81802D Unspecified open wound, left lower leg, subsequent encounter: Secondary | ICD-10-CM | POA: Insufficient documentation

## 2020-11-28 NOTE — Therapy (Signed)
[place], [place], [address] Phone: [phone]   Fax:  [phone]  Wound Care Therapy  Patient Details  Name: Bruce Cruz MRN: [id number] Date of Birth: [birth year] Referring Provider (PT): Junious Dresser   Encounter Date: 11/28/2020   PT End of Session - 11/28/20 1617    Visit Number 14    Number of Visits 18    Date for PT Re-Evaluation 12/06/20    Authorization Type work comp - 6 visits approved initally, inquiring about additional visits - per [place] - Visit Number 14    Authorization - Number of Visits 6    Progress Note Due on Visit 18    PT Start Time [phone]    PT Stop Time 0955    PT Time Calculation (min) 39 min    Activity Tolerance Patient tolerated treatment well    Behavior During Therapy [facility] for tasks assessed/performed           Past Medical History:  Diagnosis Date  . Hypertension   . Multiple vitamin deficiency   . Obesity   . OSA (obstructive sleep apnea)     History reviewed. No pertinent surgical history.  There were no vitals filed for this visit.      [facility] PT Assessment - 11/28/20 0001      Assessment   Medical Diagnosis Non healing wound on Lt LE    Referring Provider (PT) Junious Dresser    Onset Date/Surgical Date 10/11/20                   Wound Therapy - 11/28/20 0001    Subjective STates no change since last session, arrives with dressings intact.    Patient and Family Stated Goals wound to heal    Date of Onset 10/11/20    Prior Treatments self care ,(changing dressing 2x a day, antibiotics, IV antibiotics, MD debridemetn.    Pain Scale Faces    Faces Pain Scale Hurts little more    Evaluation and Treatment Procedures Explained to Patient/Family Yes    Evaluation and Treatment Procedures agreed to    Wound Properties Date First Assessed: 10/24/20 Time First Assessed: 1450 Location: Pretibial Location Orientation: Left;Proximal  Wound Description (Comments): adherent eschar covering wound Present on Admission: Yes   Wound Image View All Images View Images    Dressing Type Compression wrap;Impregnated gauze (bismuth);Hydrogel   medihoney to wound   Dressing Changed Changed    Dressing Status Old drainage    Dressing Change Frequency PRN    Site / Wound Assessment Bleeding;Granulation tissue;Yellow;Pink;Painful    % Wound base Red or Granulating 60%    % Wound base Yellow/Fibrinous Exudate 40%    Peri-wound Assessment Edema;Erythema (blanchable)    Wound Length (cm) 3 cm   was 3   Wound Width (cm) 2.1 cm   was 2   Wound Depth (cm) 0.8 cm   was .8   Wound Volume (cm^3) 5.04 cm^3    Wound Surface Area (cm^2) 6.3 cm^2    Undermining (cm) from 10 to 6 o'clock    Margins Epibole (rolled edges)    Closure None    Drainage Amount Minimal    Drainage Description Serosanguineous    Treatment Cleansed;Debridement (Selective)    Selective Debridement - Location wound bed and epiboled edges    Selective Debridement - Tools Used Forceps;Scalpel    Selective Debridement - Tissue Removed devitalized tissue  Wound Therapy - Clinical Statement Wound with increased slough on this date and increased pain noted around inferior border of wound. Minimal changes in wound measurements but with recent increase in depth, pain and slough, patient may benefit from antibiotics. Instructed patient to reach out to MD and will also follow up with health care provider about possible wound culture. Wound less dried out on this date. Continued with medihoney, xeroform and hydrogel followed by profore lite. Increased swelling noted in leg, if this continues will transition back to profore bandages with light cotton around foot so patient can wear work boots. Picture taken today to continue to monitor progress. Will continue with current POC.    Factors Delaying/Impairing Wound Healing Altered sensation    Hydrotherapy Plan Debridement;Dressing  change;Patient/family education    Wound Therapy - Frequency 3X / week    Wound Therapy - Current Recommendations PT    Wound Plan debridement and dressing change, check depth to see if deepening for possible need for antibiotics, measure undermining    Dressing  medihoney, xerofrm, hydrogel, 4x4, profore lite                     PT Short Term Goals - 11/23/20 1403      PT SHORT TERM GOAL #1   Title Pt pain level to decrease to no greater than a 1/10 to allow pt to be able to freely walk without increased pain.    Time 2    Period Weeks    Status Achieved    Target Date 11/07/20      PT SHORT TERM GOAL #2   Title PT wounds to be 100% granulated to reduce risk of cellulitis.    Time 2    Period Weeks    Status Partially Met             PT Long Term Goals - 11/23/20 1403      PT LONG TERM GOAL #1   Title Pt to have no pain in his Lt LE    Time 4    Period Weeks    Status Achieved      PT LONG TERM GOAL #2   Title PT wound to be healed.    Time 4    Period Weeks    Status Partially Met                 Plan - 11/28/20 1617    Clinical Impression Statement see above    Examination-Activity Limitations Hygiene/Grooming    Examination-Participation Restrictions Occupation    Stability/Clinical Decision Making Stable/Uncomplicated    Rehab Potential Good    PT Frequency 3x / week    PT Duration --   to continue for 2 more weeks for a total of 6 weeks   PT Treatment/Interventions Other (comment)    PT Next Visit Plan Continue with debridement until eschar has been removed then maintain a healthy environment conductive to wound healing    Consulted and Agree with Plan of Care Patient           Patient will benefit from skilled therapeutic intervention in order to improve the following deficits and impairments:  Pain,Decreased skin integrity,Increased edema  Visit Diagnosis: Open leg wound, left, subsequent encounter  Pain in left lower  leg     Problem List Patient Active Problem List   Diagnosis Date Noted  . Cellulitis 11/03/2020    4:25 PM, 11/28/20 Jerene Pitch, DPT Physical Therapy with [place]  [facility]  [phone] office  [place] [address] [place], [place], [address] Phone: [phone]   Fax:  [phone]  Name: Bruce Cruz MRN: [id number] Date of Birth: 1971/01/06

## 2020-11-30 ENCOUNTER — Other Ambulatory Visit: Payer: Self-pay

## 2020-11-30 ENCOUNTER — Encounter (HOSPITAL_COMMUNITY): Payer: Self-pay

## 2020-11-30 ENCOUNTER — Telehealth (HOSPITAL_COMMUNITY): Payer: Self-pay

## 2020-11-30 ENCOUNTER — Ambulatory Visit (HOSPITAL_COMMUNITY): Payer: Worker's Compensation

## 2020-11-30 DIAGNOSIS — S81802D Unspecified open wound, left lower leg, subsequent encounter: Secondary | ICD-10-CM | POA: Diagnosis not present

## 2020-11-30 DIAGNOSIS — M79662 Pain in left lower leg: Secondary | ICD-10-CM

## 2020-11-30 NOTE — Telephone Encounter (Signed)
[name] called from [facility] stating that Bruce Cruz's specimen container  was open  and the wrong swab was used. She needs the E-Swab (long swab) not the short one. She requested that the culture be redone and made a note that the patient will return on Monday 12/03/2020.

## 2020-11-30 NOTE — Therapy (Signed)
[facility] [place], [place], [address] Phone: [phone]   Fax:  [phone]  Wound Care Therapy  Patient Details  Name: Bruce Cruz MRN: [id number] Date of Birth: 1971/06/09 Referring Provider (PT): Junious Dresser   Encounter Date: 11/30/2020   PT End of Session - 11/30/20 0934    Visit Number 15    Number of Visits 18    Date for PT Re-Evaluation 12/06/20    Authorization Type work comp - 6 visits approved initally, inquiring about additional visits - per Izora Gala    Authorization - Visit Number 15    Progress Note Due on Visit 18    PT Start Time 0825    PT Stop Time 0913    PT Time Calculation (min) 48 min    Activity Tolerance Patient tolerated treatment well    Behavior During Therapy [facility] for tasks assessed/performed           Past Medical History:  Diagnosis Date  . Hypertension   . Multiple vitamin deficiency   . Obesity   . OSA (obstructive sleep apnea)     History reviewed. No pertinent surgical history.  There were no vitals filed for this visit.    Subjective Assessment - 11/30/20 0945    Subjective Pt reports some burning with the medihoney, no reports of pain currenlty.  Does report some tenderness during inferior wound debridment.    Currently in Pain? No/denies                     Wound Therapy - 11/30/20 0001    Subjective Pt reports some burning with the medihoney, no reports of pain currenlty.  Does report some tenderness during inferior wound debridment.    Patient and Family Stated Goals wound to heal    Date of Onset 10/11/20    Prior Treatments self care ,(changing dressing 2x a day, antibiotics, IV antibiotics, MD debridemetn.    Pain Scale 0-10    Pain Score 0-No pain    Evaluation and Treatment Procedures Explained to Patient/Family Yes    Evaluation and Treatment Procedures agreed to    Wound Properties Date First Assessed: 10/24/20 Time First Assessed: 1450  Location: Pretibial Location Orientation: Left;Proximal Wound Description (Comments): adherent eschar covering wound Present on Admission: Yes   Wound Image View All Images View Images    Dressing Type --   medihoney, xeroform, hydrogel, extracotton, profore lite   Dressing Changed Changed    Dressing Status Old drainage    Dressing Change Frequency PRN    Site / Wound Assessment Bleeding;Granulation tissue;Yellow;Red    % Wound base Red or Granulating 65%    % Wound base Yellow/Fibrinous Exudate 35%    Peri-wound Assessment Edema;Erythema (blanchable)    Wound Length (cm) 3 cm    Wound Width (cm) 2 cm    Wound Depth (cm) 0.8 cm    Wound Volume (cm^3) 4.8 cm^3    Wound Surface Area (cm^2) 6 cm^2    Margins Epibole (rolled edges)    Closure None    Drainage Amount Minimal    Drainage Description Serosanguineous    Treatment Cleansed;Debridement (Selective)    Selective Debridement - Location wound bed and epiboled edges    Selective Debridement - Tools Used Forceps;Scalpel    Selective Debridement - Tissue Removed devitalized tissue    Wound Therapy - Clinical Statement Selective debridement for debridement of adherent slough to promote healing.  Pt arrived  wiht increased slough this session, feel releated to current medihoney and hydrogel.  Received order for wound culture and was complete this session.  Received call from lab later that used wrong culture, need to use E-swab (longer swab).  Therapist picked up appropriate culture from lab and plan to redue next session.  Reports of comfort at EOS.    Factors Delaying/Impairing Wound Healing Altered sensation    Hydrotherapy Plan Debridement;Dressing change;Patient/family education    Wound Therapy - Frequency 3X / week    Wound Therapy - Current Recommendations PT    Wound Plan Complete culture and deliver to lab.  debridement and dressing change, check depth to see if deepening for possible need for antibiotics, measure undermining     Dressing  medihoney, xerofrm, hydrogel, 4x4, profore lite                     PT Short Term Goals - 11/23/20 1403      PT SHORT TERM GOAL #1   Title Pt pain level to decrease to no greater than a 1/10 to allow pt to be able to freely walk without increased pain.    Time 2    Period Weeks    Status Achieved    Target Date 11/07/20      PT SHORT TERM GOAL #2   Title PT wounds to be 100% granulated to reduce risk of cellulitis.    Time 2    Period Weeks    Status Partially Met             PT Long Term Goals - 11/23/20 1403      PT LONG TERM GOAL #1   Title Pt to have no pain in his Lt LE    Time 4    Period Weeks    Status Achieved      PT LONG TERM GOAL #2   Title PT wound to be healed.    Time 4    Period Weeks    Status Partially Met                  Patient will benefit from skilled therapeutic intervention in order to improve the following deficits and impairments:     Visit Diagnosis: Pain in left lower leg  Open leg wound, left, subsequent encounter     Problem List Patient Active Problem List   Diagnosis Date Noted  . Cellulitis 11/03/2020   Ihor Austin, LPTA/CLT; CBIS [phone]  Bruce Cruz 11/30/2020, 7:13 PM  [facility] [address] [place], [place], [address] Phone: [phone]   Fax:  [phone]  Name: Bruce Cruz MRN: [id number] Date of Birth: Oct 31, 1970

## 2020-12-03 ENCOUNTER — Other Ambulatory Visit (HOSPITAL_COMMUNITY)
Admission: RE | Admit: 2020-12-03 | Discharge: 2020-12-03 | Disposition: A | Discharge status: Home / Self Care | Payer: BC Managed Care – PPO | Source: Other Acute Inpatient Hospital

## 2020-12-03 ENCOUNTER — Other Ambulatory Visit: Payer: Self-pay

## 2020-12-03 ENCOUNTER — Encounter (HOSPITAL_COMMUNITY): Payer: Self-pay | Admitting: Physical Therapy

## 2020-12-03 ENCOUNTER — Ambulatory Visit (HOSPITAL_COMMUNITY): Payer: Worker's Compensation | Admitting: Physical Therapy

## 2020-12-03 DIAGNOSIS — S81802D Unspecified open wound, left lower leg, subsequent encounter: Secondary | ICD-10-CM | POA: Diagnosis not present

## 2020-12-03 DIAGNOSIS — Z029 Encounter for administrative examinations, unspecified: Secondary | ICD-10-CM | POA: Diagnosis not present

## 2020-12-03 DIAGNOSIS — X58XXXD Exposure to other specified factors, subsequent encounter: Secondary | ICD-10-CM | POA: Diagnosis not present

## 2020-12-03 DIAGNOSIS — M79662 Pain in left lower leg: Secondary | ICD-10-CM

## 2020-12-03 NOTE — Therapy (Signed)
[facility], [place], [address] Phone: [phone]   Fax:  [phone]  Wound Care Therapy  Patient Details  Name: Bruce Cruz MRN: [id number] Date of Birth: Sep 30, 1971 Referring Provider (PT): Junious Dresser   Encounter Date: 12/03/2020   PT End of Session - 12/03/20 1012    Visit Number 16    Number of Visits 18    Date for PT Re-Evaluation 12/06/20    Authorization Type work comp - 6 visits approved initally, inquiring about additional visits - per Izora Gala    Authorization - Visit Number 16    Progress Note Due on Visit 18    PT Start Time 0915    PT Stop Time 1005    PT Time Calculation (min) 50 min    Activity Tolerance Patient tolerated treatment well    Behavior During Therapy [facility] for tasks assessed/performed           Past Medical History:  Diagnosis Date  . Hypertension   . Multiple vitamin deficiency   . Obesity   . OSA (obstructive sleep apnea)     History reviewed. No pertinent surgical history.  There were no vitals filed for this visit.               Wound Therapy - 12/03/20 0001    Subjective Pt reports no pain and compression has been alright. Some burning with medihoney    Patient and Family Stated Goals wound to heal    Date of Onset 10/11/20    Prior Treatments self care ,(changing dressing 2x a day, antibiotics, IV antibiotics, MD debridemetn.    Pain Scale 0-10    Pain Score 0-No pain    Evaluation and Treatment Procedures Explained to Patient/Family Yes    Evaluation and Treatment Procedures agreed to    Wound Properties Date First Assessed: 10/24/20 Time First Assessed: 1450 Location: Pretibial Location Orientation: Left;Proximal Wound Description (Comments): adherent eschar covering wound Present on Admission: Yes   Dressing Type --   medihoney, xeroform, hydrogel, extracotton, profore lite   Dressing Changed Changed    Dressing Status Old drainage    Dressing  Change Frequency PRN    Site / Wound Assessment Bleeding;Granulation tissue;Yellow;Red    % Wound base Red or Granulating 75%    % Wound base Yellow/Fibrinous Exudate 25%    Peri-wound Assessment Erythema (blanchable);Edema    Undermining (cm) at 11-12 o clock    Margins Epibole (rolled edges)    Closure None    Drainage Amount Minimal    Drainage Description Serosanguineous    Treatment Cleansed;Debridement (Selective)    Selective Debridement - Location wound bed and epiboled edges    Selective Debridement - Tools Used Forceps;Scalpel    Selective Debridement - Tissue Removed devitalized tissue    Wound Therapy - Clinical Statement Cleansed LLE, selective debridement to adherent slough and devitilized tissue at wound boarders. Patient with continued undermining at 11-12 o'clock. Patient with continued edema in periwound area. Completed wound culture. Continued with medihoney, xeroform, hydrogel, extracotton. Added regular profore for additional compression due to continued edema. Patient tolerated session well today and wound appears to be healing well with current plan with increasing granulation tissue.    Factors Delaying/Impairing Wound Healing Altered sensation    Hydrotherapy Plan Debridement;Dressing change;Patient/family education    Wound Therapy - Frequency 3X / week    Wound Therapy - Current Recommendations PT    Wound Plan Complete culture and  deliver to lab.  debridement and dressing change, check depth to see if deepening for possible need for antibiotics, measure undermining    Dressing  medihoney, xerofrm, hydrogel, 4x4, profore                     PT Short Term Goals - 11/23/20 1403      PT SHORT TERM GOAL #1   Title Pt pain level to decrease to no greater than a 1/10 to allow pt to be able to freely walk without increased pain.    Time 2    Period Weeks    Status Achieved    Target Date 11/07/20      PT SHORT TERM GOAL #2   Title PT wounds to be 100%  granulated to reduce risk of cellulitis.    Time 2    Period Weeks    Status Partially Met             PT Long Term Goals - 11/23/20 1403      PT LONG TERM GOAL #1   Title Pt to have no pain in his Lt LE    Time 4    Period Weeks    Status Achieved      PT LONG TERM GOAL #2   Title PT wound to be healed.    Time 4    Period Weeks    Status Partially Met                 Plan - 12/03/20 1014    Clinical Impression Statement see above    Examination-Activity Limitations Hygiene/Grooming    Examination-Participation Restrictions Occupation    Stability/Clinical Decision Making Stable/Uncomplicated    Rehab Potential Good    PT Frequency 3x / week    PT Duration --   to continue for 2 more weeks for a total of 6 weeks   PT Treatment/Interventions Other (comment)    PT Next Visit Plan Continue with debridement until slough has been removed then maintain a healthy environment conductive to wound healing    Consulted and Agree with Plan of Care Patient           Patient will benefit from skilled therapeutic intervention in order to improve the following deficits and impairments:  Pain,Decreased skin integrity,Increased edema  Visit Diagnosis: Pain in left lower leg  Open leg wound, left, subsequent encounter     Problem List Patient Active Problem List   Diagnosis Date Noted  . Cellulitis 11/03/2020    10:23 AM, 12/03/20 Mearl Latin PT, DPT Physical Therapist at [facility] [place], [place], [address] Phone: [phone]   Fax:  [phone]  Name: Bruce Cruz MRN: [id number] Date of Birth: 23-May-1971

## 2020-12-05 ENCOUNTER — Ambulatory Visit (HOSPITAL_COMMUNITY): Payer: Worker's Compensation | Admitting: Physical Therapy

## 2020-12-05 ENCOUNTER — Encounter (HOSPITAL_COMMUNITY): Payer: Self-pay | Admitting: Physical Therapy

## 2020-12-05 ENCOUNTER — Other Ambulatory Visit: Payer: Self-pay

## 2020-12-05 DIAGNOSIS — M79662 Pain in left lower leg: Secondary | ICD-10-CM

## 2020-12-05 DIAGNOSIS — S81802D Unspecified open wound, left lower leg, subsequent encounter: Secondary | ICD-10-CM | POA: Diagnosis not present

## 2020-12-05 NOTE — Therapy (Signed)
[place], [place], [address] Phone: [phone]   Fax:  [phone]  Physical Therapy Treatment  Patient Details  Name: Bruce Cruz MRN: [id number] Date of Birth: Dec 03, 1970 Referring Provider (PT): Junious Dresser   Encounter Date: 12/05/2020   PT End of Session - 12/05/20 1352    Visit Number 17    Number of Visits 18    Date for PT Re-Evaluation 12/06/20    Authorization Type work comp - 6 visits approved initally, inquiring about additional visits - per Jay - Visit Number 17    Progress Note Due on Visit 18    PT Start Time 0623    PT Stop Time 1130    PT Time Calculation (min) 38 min    Activity Tolerance Patient tolerated treatment well    Behavior During Therapy [facility] for tasks assessed/performed           Past Medical History:  Diagnosis Date  . Hypertension   . Multiple vitamin deficiency   . Obesity   . OSA (obstructive sleep apnea)     History reviewed. No pertinent surgical history.  There were no vitals filed for this visit.           Wound Therapy - 12/05/20 0001    Subjective Pt reports no pain and compression has been alright. Some burning with medihoney    Patient and Family Stated Goals wound to heal    Date of Onset 10/11/20    Prior Treatments self care ,(changing dressing 2x a day, antibiotics, IV antibiotics, MD debridemetn.    Pain Scale 0-10    Pain Score 0-No pain    Evaluation and Treatment Procedures Explained to Patient/Family Yes    Evaluation and Treatment Procedures agreed to    Wound Properties Date First Assessed: 10/24/20 Time First Assessed: 1450 Location: Pretibial Location Orientation: Left;Proximal Wound Description (Comments): adherent eschar covering wound Present on Admission: Yes   Dressing Type Compression wrap;Hydrogel;Impregnated gauze (bismuth)   medihoney   Dressing Changed Changed    Dressing Status Old drainage     Dressing Change Frequency PRN    Site / Wound Assessment Granulation tissue;Bleeding    % Wound base Red or Granulating 90%   after debridement   % Wound base Yellow/Fibrinous Exudate 10%    Peri-wound Assessment Edema    Wound Length (cm) 2.8 cm    Wound Width (cm) 1.7 cm    Wound Depth (cm) 0.4 cm    Wound Volume (cm^3) 1.9 cm^3    Wound Surface Area (cm^2) 4.76 cm^2    Undermining (cm) 10-12;  .2    Margins Epibole (rolled edges)    Closure None    Drainage Amount Scant    Drainage Description Serosanguineous    Treatment Cleansed;Debridement (Selective)    Selective Debridement - Location wound bed and epiboled edges    Selective Debridement - Tools Used Forceps    Selective Debridement - Tissue Removed devitalized tissue, epiboled edges    Wound Therapy - Clinical Statement PT wound bed is much cleaner with therapist able to debride to 90% granulation, wound remeasured with noted raised wound bed, overall decreased swelling.  Wound healing progreassing well recommend to continue treatment to ensure an healthy environment for wound healing.    Factors Delaying/Impairing Wound Healing Altered sensation    Hydrotherapy Plan Debridement;Dressing change;Patient/family education    Wound Therapy - Frequency 3X / week  Wound Therapy - Current Recommendations PT    Wound Plan debride and dressing change as appropriate for wound bed.    Dressing  medihoney, xerofrm, hydrogel, 4x4, profore                      PT Short Term Goals - 11/23/20 1403      PT SHORT TERM GOAL #1   Title Pt pain level to decrease to no greater than a 1/10 to allow pt to be able to freely walk without increased pain.    Time 2    Period Weeks    Status Achieved    Target Date 11/07/20      PT SHORT TERM GOAL #2   Title PT wounds to be 100% granulated to reduce risk of cellulitis.    Time 2    Period Weeks    Status Partially Met             PT Long Term Goals - 11/23/20 1403      PT  LONG TERM GOAL #1   Title Pt to have no pain in his Lt LE    Time 4    Period Weeks    Status Achieved      PT LONG TERM GOAL #2   Title PT wound to be healed.    Time 4    Period Weeks    Status Partially Met                 Plan - 12/05/20 1353    Clinical Impression Statement see above    Examination-Activity Limitations Hygiene/Grooming    Examination-Participation Restrictions Occupation    Stability/Clinical Decision Making Stable/Uncomplicated    Rehab Potential Good    PT Frequency 3x / week    PT Duration --   to continue for 2 more weeks for a total of 6 weeks   PT Treatment/Interventions Other (comment)    PT Next Visit Plan Continue with debridement until slough has been removed then maintain a healthy environment conductive to wound healing    Consulted and Agree with Plan of Care Patient           Patient will benefit from skilled therapeutic intervention in order to improve the following deficits and impairments:  Pain,Decreased skin integrity,Increased edema  Visit Diagnosis: Open leg wound, left, subsequent encounter  Pain in left lower leg     Problem List Patient Active Problem List   Diagnosis Date Noted  . Cellulitis 11/03/2020    Rayetta Humphrey, PT CLT [phone] 12/05/2020, 1:54 PM  [facility] [address] [place], [place], [address] Phone: [phone]   Fax:  [phone]  Name: Bruce Cruz MRN: [id number] Date of Birth: [birth year]

## 2020-12-07 ENCOUNTER — Ambulatory Visit (HOSPITAL_COMMUNITY): Payer: Worker's Compensation | Admitting: Physical Therapy

## 2020-12-07 ENCOUNTER — Encounter (HOSPITAL_COMMUNITY): Payer: Self-pay | Admitting: Physical Therapy

## 2020-12-07 ENCOUNTER — Other Ambulatory Visit: Payer: Self-pay

## 2020-12-07 DIAGNOSIS — S81802D Unspecified open wound, left lower leg, subsequent encounter: Secondary | ICD-10-CM | POA: Diagnosis not present

## 2020-12-07 NOTE — Addendum Note (Signed)
Addended by: Bella Kennedy on: 12/07/2020 03:10 PM   Modules accepted: Orders

## 2020-12-07 NOTE — Therapy (Addendum)
[place], [place], [address] Phone: [phone]   Fax:  [phone]  Wound Care Therapy  Patient Details  Name: Bruce Cruz MRN: [id number] Date of Birth: 1971-03-10 Referring Provider (PT): Junious Dresser   Encounter Date: 12/07/2020   PT End of Session - 12/07/20 1401    Visit Number 18    Number of Visits 27    Date for PT Re-Evaluation 12/28/20    Authorization Type work comp - 6 visits approved initally, inquiring about additional visits - per [place] - Visit Number 17    Progress Note Due on Visit 18    PT Start Time 6503    PT Stop Time 1353    PT Time Calculation (min) 38 min    Activity Tolerance Patient tolerated treatment well    Behavior During Therapy [facility] for tasks assessed/performed           Past Medical History:  Diagnosis Date  . Hypertension   . Multiple vitamin deficiency   . Obesity   . OSA (obstructive sleep apnea)     History reviewed. No pertinent surgical history.  There were no vitals filed for this visit.      [facility] PT Assessment - 12/07/20 0001      Assessment   Medical Diagnosis Non healing wound on Lt LE    Referring Provider (PT) Junious Dresser    Onset Date/Surgical Date 10/11/20    Prior Therapy antibiotic, IV antibiotics, debridment at MD office      Precautions   Precaution Comments cellulitis      Restrictions   Weight Bearing Restrictions No      Prior Function   Level of Independence Independent                   Wound Therapy - 12/07/20 0001    Subjective Pt states that his dressing slipped down but no pain    Patient and Family Stated Goals wound to heal    Date of Onset 10/11/20    Prior Treatments self care ,(changing dressing 2x a day, antibiotics, IV antibiotics, MD debridemetn.    Pain Score 0-No pain    Evaluation and Treatment Procedures Explained to Patient/Family Yes    Evaluation and Treatment Procedures  agreed to    Wound Properties Date First Assessed: 10/24/20 Time First Assessed: 1450 Location: Pretibial Location Orientation: Left;Proximal Wound Description (Comments): adherent eschar covering wound Present on Admission: Yes   Dressing Type Abdominal pads;Bismuth petroleum;Compression wrap;Hydrogel    Dressing Changed Changed    Dressing Status Old drainage    Dressing Change Frequency PRN    Site / Wound Assessment Granulation tissue    % Wound base Red or Granulating 100%   following debridement was 90% following debridement   % Wound base Yellow/Fibrinous Exudate 0%    Wound Length (cm) 2.8 cm   was 3 on 11/23/2020   Wound Width (cm) 1.7 cm   was 2 on 1/28   Wound Depth (cm) 0.4 cm   was .8 on 1/27   Wound Volume (cm^3) 1.9 cm^3    Wound Surface Area (cm^2) 4.76 cm^2    Undermining (cm) 10-12    Margins Epibole (rolled edges)    Closure None    Drainage Amount Scant   drainage from medihoney   Drainage Description Serosanguineous    Treatment Cleansed;Debridement (Selective)    Selective Debridement - Location wound bed  Selective Debridement - Tools Used Forceps    Selective Debridement - Tissue Removed biofilm    Wound Therapy - Clinical Statement Wound bed continues to have some slough and biofilm on initially after removal of bandage, however, it is now easily removed.  Noted increased granulation in wound bed.  Palpatable edema therefore retro massage completed prior to dressing to decrease LE edema. Pt will continue to benefit from skilled therapy to maintain a healing environment for the wound.    Factors Delaying/Impairing Wound Healing Altered sensation    Hydrotherapy Plan Debridement;Dressing change;Patient/family education    Wound Therapy - Frequency 3X / week    Wound Therapy - Current Recommendations PT    Wound Plan debride and dressing change as appropriate for wound bed.    Dressing  medihoney, xerofrm, hydrogel, 4x4, profore                      PT Short Term Goals - 12/07/20 1403      PT SHORT TERM GOAL #1   Title Pt pain level to decrease to no greater than a 1/10 to allow pt to be able to freely walk without increased pain.    Time 2    Period Weeks    Status Achieved    Target Date 11/07/20      PT SHORT TERM GOAL #2   Title PT wounds to be 100% granulated to reduce risk of cellulitis.    Time 2    Period Weeks    Status Achieved             PT Long Term Goals - 12/07/20 1403      PT LONG TERM GOAL #1   Title Pt to have no pain in his Lt LE    Time 4    Period Weeks    Status Achieved      PT LONG TERM GOAL #2   Title PT wound to be healed.    Time 4    Period Weeks    Status Partially Met                 Plan - 12/07/20 1402    Clinical Impression Statement see above    Examination-Activity Limitations Hygiene/Grooming    Examination-Participation Restrictions Occupation    Stability/Clinical Decision Making Stable/Uncomplicated    Rehab Potential Good    PT Frequency 3x / week    PT Duration --   to continue for 2 more weeks for a total of 6 weeks   PT Treatment/Interventions Other (comment)    PT Next Visit Plan Continue with debridement until slough has been removed then maintain a healthy environment conductive to wound healing    Consulted and Agree with Plan of Care Patient           Patient will benefit from skilled therapeutic intervention in order to improve the following deficits and impairments:  Pain,Decreased skin integrity,Increased edema  Visit Diagnosis: Open leg wound, left, subsequent encounter     Problem List Patient Active Problem List   Diagnosis Date Noted  . Cellulitis 11/03/2020    Rayetta Humphrey, PT CLT [phone] 12/07/2020, 3:06 PM  [facility] [address] [place], [place], [address] Phone: [phone]   Fax:  [phone]  Name: Bruce Cruz MRN: [id number] Date of Birth: [birth year]

## 2020-12-08 LAB — AEROBIC/ANAEROBIC CULTURE W GRAM STAIN (SURGICAL/DEEP WOUND): Gram Stain: NONE SEEN

## 2020-12-10 ENCOUNTER — Other Ambulatory Visit: Payer: Self-pay

## 2020-12-10 ENCOUNTER — Ambulatory Visit (HOSPITAL_COMMUNITY): Payer: Worker's Compensation | Admitting: Physical Therapy

## 2020-12-10 DIAGNOSIS — S81802D Unspecified open wound, left lower leg, subsequent encounter: Secondary | ICD-10-CM

## 2020-12-10 DIAGNOSIS — M79662 Pain in left lower leg: Secondary | ICD-10-CM

## 2020-12-10 NOTE — Therapy (Signed)
[place], [place], [address] Phone: [phone]   Fax:  [phone]  Wound Care Therapy  Patient Details  Name: Bruce Cruz MRN: [id number] Date of Birth: April 29, 1971 Referring Provider (PT): Junious Dresser   Encounter Date: 12/10/2020   PT End of Session - 12/10/20 1438    Visit Number 19    Number of Visits 27    Date for PT Re-Evaluation 12/06/20    Authorization Type work comp - 6 visits approved initally, inquiring about additional visits - per [place] - Visit Number 19    Progress Note Due on Visit 27    PT Start Time 1050    PT Stop Time 1125    PT Time Calculation (min) 35 min    Activity Tolerance Patient tolerated treatment well    Behavior During Therapy [facility] for tasks assessed/performed           Past Medical History:  Diagnosis Date  . Hypertension   . Multiple vitamin deficiency   . Obesity   . OSA (obstructive sleep apnea)     No past surgical history on file.  There were no vitals filed for this visit.               Wound Therapy - 12/10/20 1435    Subjective pt states it slipped down a little but without issues.    Patient and Family Stated Goals wound to heal    Date of Onset 10/11/20    Prior Treatments self care ,(changing dressing 2x a day, antibiotics, IV antibiotics, MD debridemetn.    Pain Score 0-No pain    Evaluation and Treatment Procedures Explained to Patient/Family Yes    Evaluation and Treatment Procedures agreed to    Wound Properties Date First Assessed: 10/24/20 Time First Assessed: 6644 Location: Pretibial Location Orientation: Left;Proximal Wound Description (Comments): adherent eschar covering wound Present on Admission: Yes   Dressing Type Impregnated gauze (bismuth)    Dressing Changed Changed    Dressing Status Old drainage    Dressing Change Frequency PRN    Site / Wound Assessment Granulation tissue    % Wound base Red or  Granulating 95%    % Wound base Yellow/Fibrinous Exudate 5%    Peri-wound Assessment Edema    Margins Attached edges (approximated)    Closure None    Drainage Amount Scant    Drainage Description Serosanguineous    Treatment Cleansed;Debridement (Selective)    Selective Debridement - Location wound bed    Selective Debridement - Tools Used Forceps;Scalpel    Selective Debridement - Tissue Removed biofilm    Wound Therapy - Clinical Statement Pt reports culture results are back per mychart notification but has not heard from MD; encouraged to call and follow up with MD to see if there is a need for antibiotic.  Wound with marbeling of slough but still with good granulation buds.  Able to debride most of the slough away to reveal increased granulation. Reduced edema inferior to wound but still slightly elevated.   Used only xeroform this session to maintain wound bed integrity.  Vaseline applied to perimeter.  Used cotton only around ankle using soft bandage (#2) around LE first instead of cotton in profore kit.   May want to consider foam over inferior edema or compressive tape if still present or worsens.  Pt reported overall comfort.    Factors Delaying/Impairing Wound Healing Altered sensation  Hydrotherapy Plan Debridement;Dressing change;Patient/family education    Wound Therapy - Frequency 3X / week    Wound Therapy - Current Recommendations PT    Wound Plan debride and dressing change as appropriate for wound bed.    Dressing  xerofrm,4x4, profore                     PT Short Term Goals - 12/07/20 1403      PT SHORT TERM GOAL #1   Title Pt pain level to decrease to no greater than a 1/10 to allow pt to be able to freely walk without increased pain.    Time 2    Period Weeks    Status Achieved    Target Date 11/07/20      PT SHORT TERM GOAL #2   Title PT wounds to be 100% granulated to reduce risk of cellulitis.    Time 2    Period Weeks    Status Achieved              PT Long Term Goals - 12/07/20 1403      PT LONG TERM GOAL #1   Title Pt to have no pain in his Lt LE    Time 4    Period Weeks    Status Achieved      PT LONG TERM GOAL #2   Title PT wound to be healed.    Time 4    Period Weeks    Status Partially Met                  Patient will benefit from skilled therapeutic intervention in order to improve the following deficits and impairments:     Visit Diagnosis: Pain in left lower leg  Open leg wound, left, subsequent encounter     Problem List Patient Active Problem List   Diagnosis Date Noted  . Cellulitis 11/03/2020   Teena Irani, PTA/CLT [phone]  Teena Irani 12/10/2020, 2:39 PM  [place], [place], [address] Phone: [phone]   Fax:  [phone]  Name: Bruce Cruz MRN: [id number] Date of Birth: August 11, 1971

## 2020-12-12 ENCOUNTER — Encounter (HOSPITAL_COMMUNITY): Payer: BC Managed Care – PPO | Admitting: Physical Therapy

## 2020-12-12 ENCOUNTER — Ambulatory Visit (HOSPITAL_COMMUNITY): Payer: Worker's Compensation | Attending: Preventative Medicine | Admitting: Physical Therapy

## 2020-12-12 ENCOUNTER — Other Ambulatory Visit: Payer: Self-pay

## 2020-12-12 DIAGNOSIS — M79662 Pain in left lower leg: Secondary | ICD-10-CM | POA: Diagnosis not present

## 2020-12-12 DIAGNOSIS — S81802D Unspecified open wound, left lower leg, subsequent encounter: Secondary | ICD-10-CM | POA: Diagnosis not present

## 2020-12-12 NOTE — Therapy (Signed)
[place], [place], [address] Phone: [phone]   Fax:  [phone]  Wound Care Therapy  Patient Details  Name: Bruce Cruz MRN: [id number] Date of Birth: 1971-07-22 Referring Provider (PT): Junious Dresser   Encounter Date: 12/12/2020   PT End of Session - 12/12/20 1358    Visit Number 20    Number of Visits 27    Date for PT Re-Evaluation 12/06/20    Authorization Type work comp - 6 visits approved initally, inquiring about additional visits - per [place] - Visit Number 20    Progress Note Due on Visit 27    PT Start Time 1315    PT Stop Time 1350    PT Time Calculation (min) 35 min    Activity Tolerance Patient tolerated treatment well    Behavior During Therapy [facility] for tasks assessed/performed           Past Medical History:  Diagnosis Date  . Hypertension   . Multiple vitamin deficiency   . Obesity   . OSA (obstructive sleep apnea)     No past surgical history on file.  There were no vitals filed for this visit.               Wound Therapy - 12/12/20 0001    Subjective No complaint    Patient and Family Stated Goals wound to heal    Date of Onset 10/11/20    Prior Treatments self care ,(changing dressing 2x a day, antibiotics, IV antibiotics, MD debridemetn.    Pain Score 0-No pain    Evaluation and Treatment Procedures Explained to Patient/Family Yes    Evaluation and Treatment Procedures agreed to    Wound Properties Date First Assessed: 10/24/20 Time First Assessed: 1450 Location: Pretibial Location Orientation: Left;Proximal Wound Description (Comments): adherent eschar covering wound Present on Admission: Yes   Dressing Type Compression wrap;Hydrogel    Dressing Changed Changed    Dressing Status Old drainage    Dressing Change Frequency PRN    Site / Wound Assessment Granulation tissue    % Wound base Red or Granulating 100%   following debridement    Peri-wound Assessment Intact    Wound Length (cm) 2.4 cm   was 2.8   Wound Width (cm) 1.1 cm   was 1.7   Wound Depth (cm) 0.2 cm   was .4   Wound Volume (cm^3) 0.53 cm^3    Wound Surface Area (cm^2) 2.64 cm^2    Undermining (cm) 9-12; .2 cm    Margins Unattached edges (unapproximated)   as well as attached.   Closure None    Drainage Amount Minimal    Drainage Description Serous    Treatment Cleansed;Debridement (Selective)    Selective Debridement - Location wound bed    Selective Debridement - Tools Used Forceps;Scalpel    Selective Debridement - Tissue Removed biofilm    Wound Therapy - Clinical Statement Pt wound is progressing well.  Noted reduction in size in all areas.  Chaning compression dressing with cotton in the center allowed dressing to stay up without sliding down.  No edema noted therefore therapist reduced compression to profore lite vx. profore.    Factors Delaying/Impairing Wound Healing Altered sensation    Hydrotherapy Plan Debridement;Dressing change;Patient/family education    Wound Therapy - Frequency 3X / week    Wound Therapy - Current Recommendations PT    Wound Plan See how wound looks with  use of hydrogel only, assess edema if none is noted decrease dressing to kerlix and coban, then decreased to kerlix and netting to prepare for discharge.    Dressing  hydrogel,4x4, profore                     PT Short Term Goals - 12/07/20 1403      PT SHORT TERM GOAL #1   Title Pt pain level to decrease to no greater than a 1/10 to allow pt to be able to freely walk without increased pain.    Time 2    Period Weeks    Status Achieved    Target Date 11/07/20      PT SHORT TERM GOAL #2   Title PT wounds to be 100% granulated to reduce risk of cellulitis.    Time 2    Period Weeks    Status Achieved             PT Long Term Goals - 12/07/20 1403      PT LONG TERM GOAL #1   Title Pt to have no pain in his Lt LE    Time 4    Period Weeks     Status Achieved      PT LONG TERM GOAL #2   Title PT wound to be healed.    Time 4    Period Weeks    Status Partially Met                 Plan - 12/12/20 1358    Clinical Impression Statement see above    Examination-Activity Limitations Hygiene/Grooming    Examination-Participation Restrictions Occupation    Stability/Clinical Decision Making Stable/Uncomplicated    Rehab Potential Good    PT Frequency 3x / week    PT Duration --   to continue for 2 more weeks for a total of 6 weeks   PT Treatment/Interventions Other (comment)    PT Next Visit Plan Continue with debridement until slough has been removed then maintain a healthy environment conductive to wound healing    Consulted and Agree with Plan of Care Patient           Patient will benefit from skilled therapeutic intervention in order to improve the following deficits and impairments:  Pain,Decreased skin integrity,Increased edema  Visit Diagnosis: Open leg wound, left, subsequent encounter     Problem List Patient Active Problem List   Diagnosis Date Noted  . Cellulitis 11/03/2020    Rayetta Humphrey, PT CLT [phone] 12/12/2020, 1:59 PM  [place] [address] [place], [place], [address] Phone: [phone]   Fax:  [phone]  Name: PRITESH SOBECKI MRN: [id number] Date of Birth: 10/28/1970

## 2020-12-14 ENCOUNTER — Ambulatory Visit (HOSPITAL_COMMUNITY): Payer: Worker's Compensation | Admitting: Physical Therapy

## 2020-12-14 ENCOUNTER — Encounter (HOSPITAL_COMMUNITY): Payer: Self-pay | Admitting: Physical Therapy

## 2020-12-14 ENCOUNTER — Other Ambulatory Visit: Payer: Self-pay

## 2020-12-14 DIAGNOSIS — S81802D Unspecified open wound, left lower leg, subsequent encounter: Secondary | ICD-10-CM

## 2020-12-14 DIAGNOSIS — M79662 Pain in left lower leg: Secondary | ICD-10-CM | POA: Diagnosis not present

## 2020-12-14 NOTE — Therapy (Signed)
[place], [place], [address] Phone: [phone]   Fax:  [phone]  Wound Care Therapy  Patient Details  Name: Bruce Cruz MRN: [id number] Date of Birth: August 31, 1971 Referring Provider (PT): Junious Dresser   Encounter Date: 12/14/2020   PT End of Session - 12/14/20 1219    Visit Number 21    Number of Visits 27    Date for PT Re-Evaluation 3/10   Authorization Type work comp - 6 visits approved initally, inquiring about additional visits - per [place] - Visit Number 21    Progress Note Due on Visit 27    PT Start Time 1055    PT Stop Time 1130    PT Time Calculation (min) 35 min    Activity Tolerance Patient tolerated treatment well    Behavior During Therapy [facility] for tasks assessed/performed           Past Medical History:  Diagnosis Date  . Hypertension   . Multiple vitamin deficiency   . Obesity   . OSA (obstructive sleep apnea)     History reviewed. No pertinent surgical history.  There were no vitals filed for this visit.               Wound Therapy - 12/14/20 0001    Subjective Pt states that the bandage stayed up but it is the first time he has had some discomfort at his wound site.    Patient and Family Stated Goals wound to heal    Date of Onset 10/11/20    Prior Treatments self care ,(changing dressing 2x a day, antibiotics, IV antibiotics, MD debridemetn.    Pain Score 2     Faces Pain Scale Hurts a little bit    Pain Type Acute pain    Pain Location Leg    Pain Orientation Left    Evaluation and Treatment Procedures Explained to Patient/Family Yes    Evaluation and Treatment Procedures agreed to    Wound Properties Date First Assessed: 10/24/20 Time First Assessed: 1450 Location: Pretibial Location Orientation: Left;Proximal Wound Description (Comments): adherent eschar covering wound Present on Admission: Yes   Dressing Type Compression  wrap;Impregnated gauze (bismuth)    Dressing Changed Changed    Dressing Status Old drainage    Dressing Change Frequency PRN    Site / Wound Assessment Granulation tissue;Yellow    % Wound base Red or Granulating 90%    % Wound base Yellow/Fibrinous Exudate 10%    Peri-wound Assessment Edema    Closure None    Drainage Amount Minimal    Drainage Description Serous    Treatment Cleansed;Debridement (Selective)    Selective Debridement - Location wound bed    Selective Debridement - Tools Used Forceps;Scalpel    Selective Debridement - Tissue Removed biofilm; maceration    Wound Therapy - Clinical Statement Pt has noted maceration with noted rednee,irritation along edges of wound.  Once bandage was removed redness went away.  Due poor response to last dressing therapist went back to xeorform, used netting on LE prior to applying profore lite.    Factors Delaying/Impairing Wound Healing Altered sensation    Hydrotherapy Plan Debridement;Dressing change;Patient/family education    Wound Therapy - Frequency 3X / week    Wound Therapy - Current Recommendations PT    Wound Plan Assess wound with returning to xeroform and profore lige    Dressing  cleanse, moisturized, xeroform, profore lite.  PT Short Term Goals - 12/07/20 1403      PT SHORT TERM GOAL #1   Title Pt pain level to decrease to no greater than a 1/10 to allow pt to be able to freely walk without increased pain.    Time 2    Period Weeks    Status Achieved    Target Date 11/07/20      PT SHORT TERM GOAL #2   Title PT wounds to be 100% granulated to reduce risk of cellulitis.    Time 2    Period Weeks    Status Achieved             PT Long Term Goals - 12/07/20 1403      PT LONG TERM GOAL #1   Title Pt to have no pain in his Lt LE    Time 4    Period Weeks    Status Achieved      PT LONG TERM GOAL #2   Title PT wound to be healed.    Time 4    Period Weeks    Status Partially  Met                 Plan - 12/14/20 1219    Clinical Impression Statement see above    Examination-Activity Limitations Hygiene/Grooming    Examination-Participation Restrictions Occupation    Stability/Clinical Decision Making Stable/Uncomplicated    Rehab Potential Good    PT Frequency 3x / week    PT Duration --   to continue for 2 more weeks for a total of 6 weeks   PT Treatment/Interventions Other (comment)    PT Next Visit Plan Continue with debridement until slough has been removed then maintain a healthy environment conductive to wound healing    Consulted and Agree with Plan of Care Patient           Patient will benefit from skilled therapeutic intervention in order to improve the following deficits and impairments:  Pain,Decreased skin integrity,Increased edema  Visit Diagnosis: Open leg wound, left, subsequent encounter  Pain in left lower leg     Problem List Patient Active Problem List   Diagnosis Date Noted  . Cellulitis 11/03/2020   Rayetta Humphrey, PT CLT [phone] 12/14/2020, 12:21 PM  [place] [address] [place], [place], [address] Phone: [phone]   Fax:  [phone]  Name: Bruce Cruz MRN: [id number] Date of Birth: 20-Nov-1970

## 2020-12-16 NOTE — Progress Notes (Signed)
Cardiology Office Note:    Date:  12/17/2020   ID:  Bruce Cruz, DOB 1971/01/01, MRN [id number]  PCP:  Patient, No Pcp Per   [facility]  Cardiologist:  No primary care provider on file.  Advanced Practice Provider:  No care team member to display Electrophysiologist:  None    Referring MD: No ref. provider found    History of Present Illness:    Bruce Cruz is a 50 y.o. 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  During last visit on 11/06/20, we changes his dilt to amlodipine 10, continued Olmesartan 40-HCTZ 25, started spironolactone 25mg  and coreg 3.125mg  BID. Blood pressures have been better controlled at home running mainly 120-140s.   His regimen currently includes:  norvasc 10mg  at night, coreg 3.125mg  BID, benicar in the AM, aldactone 25mg  in the morning. Pressures are starting to elevate in the evening. He remains asymtomatic with no chest pain, SOB, lightheadedness, dizziness or headaches. He is not very active as works in . Has tried to cut back on sodium but has not been watching labels too closely. He is interested in losing weight.  Past Medical History:  Diagnosis Date  . Hypertension   . Multiple vitamin deficiency   . Obesity   . OSA (obstructive sleep apnea)     No past surgical history on file.  Current Medications: Current Meds  Medication Sig  . amLODipine (NORVASC) 10 MG tablet Take 1 tablet (10 mg total) by mouth daily.  . Biotin 10 MG CAPS Take by mouth.  . carvedilol (COREG) 6.25 MG tablet Take 1 tablet (6.25 mg total) by mouth 2 (two) times daily.  . Cholecalciferol (VITAMIN D3) 25 MCG (1000 UT) CAPS Take by mouth daily.  . Ferrous Sulfate (IRON PO) Take 25 mg by mouth daily.  Oil (OMEGA-3) 500 MG CAPS Take 500 mg by mouth daily.  . Magnesium Oxide 250 MG TABS Take by mouth daily.  . Multiple Vitamin  (MULTIVITAMIN) capsule Take 1 capsule by mouth daily.  olmesartan-hydrochlorothiazide (BENICAR HCT) 40-25 MG tablet Take 1 tablet by mouth daily.  Consulting civil engineer spironolactone (ALDACTONE) 25 MG tablet Take 1 tablet (25 mg total) by mouth daily.  . tadalafil (CIALIS) 5 MG tablet Take 5 mg by mouth as needed for erectile dysfunction.  . Turmeric Curcumin 500 MG CAPS Take 1 capsule by mouth daily.  . [DISCONTINUED] carvedilol (COREG) 3.125 MG tablet Take 1 tablet (3.125 mg total) by mouth 2 (two) times daily.     Allergies:   Patient has no known allergies.   Social History   Socioeconomic History  . Marital status: Single    Spouse name: Not on file  . Number of children: Not on file  . Years of education: Not on file  . Highest education level: Not on file  Occupational History  . Not on file  Tobacco Use  . Smoking status: Current Every Day Smoker    Types: Cigars  . Smokeless tobacco: Never Used  Substance and Sexual Activity  . Alcohol use: Yes  . Drug use: Never  . Sexual activity: Not on file  Other Topics Concern  . Not on file  Social History Narrative  . Not on file   Social Determinants of Health   Financial Resource Strain: Not on file  Food Insecurity: Not on file  Transportation  Needs: Not on file  Physical Activity: Not on file  Stress: Not on file  Social Connections: Not on file     Family History: The patient's family history includes Heart attack (age of onset: 3159) in his mother; Heart attack (age of onset: 4071) in his father.  ROS:   Please see the history of present illness.    Review of Systems  Constitutional: Negative for chills and fever.  HENT: Negative for nosebleeds.   Eyes: Negative for blurred vision and redness.  Respiratory: Negative for shortness of breath.   Cardiovascular: Negative for chest pain, palpitations, orthopnea, claudication, leg swelling and PND.  Gastrointestinal: Negative for nausea and vomiting.  Genitourinary: Negative for  dysuria.  Musculoskeletal: Negative for falls.  Neurological: Negative for dizziness and loss of consciousness.  Endo/Heme/Allergies: Negative for polydipsia.  Psychiatric/Behavioral: Negative for substance abuse.    EKGs/Labs/Other Studies Reviewed:    The following studies were reviewed today: No cardiac studies  Recent Labs: 10/22/2020: ALT 19; Hemoglobin 15.6; Platelets 304 11/16/2020: BUN 25; Creat 0.82; Potassium 4.2; Sodium 142  Recent Lipid Panel No results found for: CHOL, TRIG, HDL, CHOLHDL, VLDL, LDLCALC, LDLDIRECT    Physical Exam:    VS:  BP (!) 160/92   Pulse 65   Ht 5\' 11"  (1.803 m)   Wt 293 lb (132.9 kg)   SpO2 97%   BMI 40.87 kg/m     Wt Readings from Last 3 Encounters:  12/17/20 293 lb (132.9 kg)  11/06/20 294 lb (133.4 kg)  11/02/20 298 lb (135.2 kg)     GEN:  Well nourished, well developed in no acute distress HEENT: Normal NECK: No JVD; No carotid bruits CARDIAC: RRR, no murmurs, rubs, gallops RESPIRATORY:  Clear to auscultation without rales, wheezing or rhonchi  ABDOMEN: Soft, non-tender, non-distended MUSCULOSKELETAL:  No edema; LLE wound with dressing in place. SKIN: Warm and dry NEUROLOGIC:  Alert and oriented x 3 PSYCHIATRIC:  Normal affect   ASSESSMENT:    1. Uncontrolled hypertension   2. Morbid obesity (HCC)    PLAN:    In order of problems listed above:  #Resistent Hypertension: Improved but still elevated at the end of the day. Running mainly 110-140s at home. Has been compliant with all medications -Continue amlodipine 10mg  daily -Continue Olmesartan 40-HCTZ 25mg  daily -Continue spironolactone 25mg  daily -Increase coreg to 6.25mg  BID -Keep BP log and send in readings -Counseled about low Na diet of <2g/day -Increase daily exercise  #Morbid Obesity with BMI 41 S/p Reux-en-Y gastric bypass. BMI 41.  -Encouarged healthy diet and exercise as detailed below -Will look into coverage for ozempic/wegovy  Exercise  recommendations: Goal of exercising for at least 30 minutes a day, at least 5 times per week.  Please exercise to a moderate exertion.  This means that while exercising it is difficult to speak in full sentences, however you are not so short of breath that you feel you must stop, and not so comfortable that you can carry on a full conversation.  Exertion level should be approximately a 5/10, if 10 is the most exertion you can perform.  Diet recommendations: Recommend a heart healthy diet such as the Mediterranean diet.  This diet consists of plant based foods, healthy fats, lean meats, olive oil.  It suggests limiting the intake of simple carbohydrates such as white breads, pastries, and pastas.  It also limits the amount of red meat, wine, and dairy products such as cheese that one should consume on a daily basis.  #  Risk Stratification: -LDL well controlled at 75 -Weight loss as above -HgB A1C 5.4 -Given family history and risk factors, would benefit from coronary calcium score--discussed with patient and wants to defer for now   Medication Adjustments/Labs and Tests Ordered: Current medicines are reviewed at length with the patient today.  Concerns regarding medicines are outlined above.  No orders of the defined types were placed in this encounter.  Meds ordered this encounter  Medications  . carvedilol (COREG) 6.25 MG tablet    Sig: Take 1 tablet (6.25 mg total) by mouth 2 (two) times daily.    Dispense:  180 tablet    Refill:  3    Patient Instructions   Medication Instructions:  Your physician has recommended you make the following change in your medication:  1.  INCREASE the Coreg to 6.25 taking 1 twice a day   *If you need a refill on your cardiac medications before your next appointment, please call your pharmacy*   Lab Work: None ordered  If you have labs (blood work) drawn today and your tests are completely normal, you will receive your results only by: Marland Kitchen MyChart  Message (if you have MyChart) OR . A paper copy in the mail If you have any lab test that is abnormal or we need to change your treatment, we will call you to review the results.   Testing/Procedures: None ordered   Follow-Up: At [facility], you and your health needs are our priority.  As part of our continuing mission to provide you with exceptional heart care, we have created designated Provider Care Teams.  These Care Teams include your primary Cardiologist (physician) and Advanced Practice Providers (APPs -  Physician Assistants and Nurse Practitioners) who all work together to provide you with the care you need, when you need it.  We recommend signing up for the patient portal called "MyChart".  Sign up information is provided on this After Visit Summary.  MyChart is used to connect with patients for Virtual Visits (Telemedicine).  Patients are able to view lab/test results, encounter notes, upcoming appointments, etc.  Non-urgent messages can be sent to your provider as well.   To learn more about what you can do with MyChart, go to [email or web address].    Your next appointment:   3 month(s)  The format for your next appointment:   In Person  Provider:   Laurance Flatten, MD   Other Instructions  [email or web address]">  DASH Eating Plan DASH stands for Dietary Approaches to Stop Hypertension. The DASH eating plan is a healthy eating plan that has been shown to:  Reduce high blood pressure (hypertension).  Reduce your risk for type 2 diabetes, heart disease, and stroke.  Help with weight loss. What are tips for following this plan? Reading food labels  Check food labels for the amount of salt (sodium) per serving. Choose foods with less than 5 percent of the Daily Value of sodium. Generally, foods with less than 300 milligrams (mg) of sodium per serving fit into this eating plan.  To find whole grains, look for the  word "whole" as the first word in the ingredient list. Shopping  Buy products labeled as "low-sodium" or "no salt added."  Buy fresh foods. Avoid canned foods and pre-made or frozen meals. Cooking  Avoid adding salt when cooking. Use salt-free seasonings or herbs instead of table salt or sea salt. Check with your health care provider or pharmacist before using salt substitutes.  Do not fry foods. Cook foods  using healthy methods such as baking, boiling, grilling, roasting, and broiling instead.  Cook with heart-healthy oils, such as olive, canola, avocado, soybean, or sunflower oil. Meal planning  Eat a balanced diet that includes: ? 4 or more servings of fruits and 4 or more servings of vegetables each day. Try to fill one-half of your plate with fruits and vegetables. ? 6-8 servings of whole grains each day. ? Less than 6 oz (170 g) of lean meat, poultry, or fish each day. A 3-oz (85-g) serving of meat is about the same size as a deck of cards. One egg equals 1 oz (28 g). ? 2-3 servings of low-fat dairy each day. One serving is 1 cup (237 mL). ? 1 serving of nuts, seeds, or beans 5 times each week. ? 2-3 servings of heart-healthy fats. Healthy fats called omega-3 fatty acids are found in foods such as walnuts, flaxseeds, fortified milks, and eggs. These fats are also found in cold-water fish, such as sardines, salmon, and mackerel.  Limit how much you eat of: ? Canned or prepackaged foods. ? Food that is high in trans fat, such as some fried foods. ? Food that is high in saturated fat, such as fatty meat. ? Desserts and other sweets, sugary drinks, and other foods with added sugar. ? Full-fat dairy products.  Do not salt foods before eating.  Do not eat more than 4 egg yolks a week.  Try to eat at least 2 vegetarian meals a week.  Eat more home-cooked food and less restaurant, buffet, and fast food.   Lifestyle  When eating at a restaurant, ask that your food be prepared with  less salt or no salt, if possible.  If you drink alcohol: ? Limit how much you use to:  0-1 drink a day for women who are not pregnant.  0-2 drinks a day for men. ? Be aware of how much alcohol is in your drink. In the [place] one drink equals one 12 oz bottle of beer (355 mL), one 5 oz glass of wine (148 mL), or one 1 oz glass of hard liquor (44 mL). General information  Avoid eating more than 2,300 mg of salt a day. If you have hypertension, you may need to reduce your sodium intake to 1,500 mg a day.  Work with your health care provider to maintain a healthy body weight or to lose weight. Ask what an ideal weight is for you.  Get at least 30 minutes of exercise that causes your heart to beat faster (aerobic exercise) most days of the week. Activities may include walking, swimming, or biking.  Work with your health care provider or dietitian to adjust your eating plan to your individual calorie needs. What foods should I eat? Fruits All fresh, dried, or frozen fruit. Canned fruit in natural juice (without added sugar). Vegetables Fresh or frozen vegetables (raw, steamed, roasted, or grilled). Low-sodium or reduced-sodium tomato and vegetable juice. Low-sodium or reduced-sodium tomato sauce and tomato paste. Low-sodium or reduced-sodium canned vegetables. Grains Whole-grain or whole-wheat bread. Whole-grain or whole-wheat pasta. Brown rice. Orpah Cobb. Bulgur. Whole-grain and low-sodium cereals. Pita bread. Low-fat, low-sodium crackers. Whole-wheat flour tortillas. Meats and other proteins Skinless chicken or [place]. Ground chicken or [place]. Pork with fat trimmed off. Fish and seafood. Egg whites. Dried beans, peas, or lentils. Unsalted nuts, nut butters, and seeds. Unsalted canned beans. Lean cuts of beef with fat trimmed off. Low-sodium, lean precooked or cured meat, such as sausages or meat loaves.  Dairy Low-fat (1%) or fat-free (skim) milk. Reduced-fat, low-fat, or fat-free  cheeses. Nonfat, low-sodium ricotta or cottage cheese. Low-fat or nonfat yogurt. Low-fat, low-sodium cheese. Fats and oils Soft margarine without trans fats. Vegetable oil. Reduced-fat, low-fat, or light mayonnaise and salad dressings (reduced-sodium). Canola, safflower, olive, avocado, soybean, and sunflower oils. Avocado. Seasonings and condiments Herbs. Spices. Seasoning mixes without salt. Other foods Unsalted popcorn and pretzels. Fat-free sweets. The items listed above may not be a complete list of foods and beverages you can eat. Contact a dietitian for more information. What foods should I avoid? Fruits Canned fruit in a light or heavy syrup. Fried fruit. Fruit in cream or butter sauce. Vegetables Creamed or fried vegetables. Vegetables in a cheese sauce. Regular canned vegetables (not low-sodium or reduced-sodium). Regular canned tomato sauce and paste (not low-sodium or reduced-sodium). Regular tomato and vegetable juice (not low-sodium or reduced-sodium). Rosita Fire. Olives. Grains Baked goods made with fat, such as croissants, muffins, or some breads. Dry pasta or rice meal packs. Meats and other proteins Fatty cuts of meat. Ribs. Fried meat. Tomasa Blase. Bologna, salami, and other precooked or cured meats, such as sausages or meat loaves. Fat from the back of a pig (fatback). Bratwurst. Salted nuts and seeds. Canned beans with added salt. Canned or smoked fish. Whole eggs or egg yolks. Chicken or [place] with skin. Dairy Whole or 2% milk, cream, and half-and-half. Whole or full-fat cream cheese. Whole-fat or sweetened yogurt. Full-fat cheese. Nondairy creamers. Whipped toppings. Processed cheese and cheese spreads. Fats and oils Butter. Stick margarine. Lard. Shortening. Ghee. Bacon fat. Tropical oils, such as coconut, palm kernel, or palm oil. Seasonings and condiments Onion salt, garlic salt, seasoned salt, table salt, and sea salt. Worcestershire sauce. Tartar sauce. Barbecue sauce.  Teriyaki sauce. Soy sauce, including reduced-sodium. Steak sauce. Canned and packaged gravies. Fish sauce. Oyster sauce. Cocktail sauce. Store-bought horseradish. Ketchup. Mustard. Meat flavorings and tenderizers. Bouillon cubes. Hot sauces. Pre-made or packaged marinades. Pre-made or packaged taco seasonings. Relishes. Regular salad dressings. Other foods Salted popcorn and pretzels. The items listed above may not be a complete list of foods and beverages you should avoid. Contact a dietitian for more information. Where to find more information  National Heart, Lung, and Blood Institute: PopSteam.is  American Heart Association: www.heart.org  Academy of Nutrition and Dietetics: www.eatright.org  National Kidney Foundation: www.kidney.org Summary  The DASH eating plan is a healthy eating plan that has been shown to reduce high blood pressure (hypertension). It may also reduce your risk for type 2 diabetes, heart disease, and stroke.  When on the DASH eating plan, aim to eat more fresh fruits and vegetables, whole grains, lean proteins, low-fat dairy, and heart-healthy fats.  With the DASH eating plan, you should limit salt (sodium) intake to 2,300 mg a day. If you have hypertension, you may need to reduce your sodium intake to 1,500 mg a day.  Work with your health care provider or dietitian to adjust your eating plan to your individual calorie needs. This information is not intended to replace advice given to you by your health care provider. Make sure you discuss any questions you have with your health care provider. Document Revised: 09/16/2019 Document Reviewed: 09/16/2019 Elsevier Patient Education  2021 Elsevier Inc.    Mediterranean Diet A Mediterranean diet refers to food and lifestyle choices that are based on the traditions of countries located on the [employer] [place]. This way of eating has been shown to help prevent certain conditions and improve outcomes for  people  who have chronic diseases, like kidney disease and heart disease. What are tips for following this plan? Lifestyle  Cook and eat meals together with your family, when possible.  Drink enough fluid to keep your urine clear or pale yellow.  Be physically active every day. This includes: ? Aerobic exercise like running or swimming. ? Leisure activities like gardening, walking, or housework.  Get 7-8 hours of sleep each night.  If recommended by your health care provider, drink red wine in moderation. This means 1 glass a day for nonpregnant women and 2 glasses a day for men. A glass of wine equals 5 oz (150 mL). Reading food labels  Check the serving size of packaged foods. For foods such as rice and pasta, the serving size refers to the amount of cooked product, not dry.  Check the total fat in packaged foods. Avoid foods that have saturated fat or trans fats.  Check the ingredients list for added sugars, such as corn syrup.   Shopping  At the grocery store, buy most of your food from the areas near the walls of the store. This includes: ? Fresh fruits and vegetables (produce). ? Grains, beans, nuts, and seeds. Some of these may be available in unpackaged forms or large amounts (in bulk). ? Fresh seafood. ? Poultry and eggs. ? Low-fat dairy products.  Buy whole ingredients instead of prepackaged foods.  Buy fresh fruits and vegetables in-season from local farmers markets.  Buy frozen fruits and vegetables in resealable bags.  If you do not have access to quality fresh seafood, buy precooked frozen shrimp or canned fish, such as tuna, salmon, or sardines.  Buy small amounts of raw or cooked vegetables, salads, or olives from the deli or salad bar at your store.  Stock your pantry so you always have certain foods on hand, such as olive oil, canned tuna, canned tomatoes, rice, pasta, and beans. Cooking  Cook foods with extra-virgin olive oil instead of using butter or other  vegetable oils.  Have meat as a side dish, and have vegetables or grains as your main dish. This means having meat in small portions or adding small amounts of meat to foods like pasta or stew.  Use beans or vegetables instead of meat in common dishes like chili or lasagna.  Experiment with different cooking methods. Try roasting or broiling vegetables instead of steaming or sauteing them.  Add frozen vegetables to soups, stews, pasta, or rice.  Add nuts or seeds for added healthy fat at each meal. You can add these to yogurt, salads, or vegetable dishes.  Marinate fish or vegetables using olive oil, lemon juice, garlic, and fresh herbs. Meal planning  Plan to eat 1 vegetarian meal one day each week. Try to work up to 2 vegetarian meals, if possible.  Eat seafood 2 or more times a week.  Have healthy snacks readily available, such as: ? Vegetable sticks with hummus. ? [place] yogurt. ? Fruit and nut trail mix.  Eat balanced meals throughout the week. This includes: ? Fruit: 2-3 servings a day ? Vegetables: 4-5 servings a day ? Low-fat dairy: 2 servings a day ? Fish, poultry, or lean meat: 1 serving a day ? Beans and legumes: 2 or more servings a week ? Nuts and seeds: 1-2 servings a day ? Whole grains: 6-8 servings a day ? Extra-virgin olive oil: 3-4 servings a day  Limit red meat and sweets to only a few servings a month   What  are my food choices?  Mediterranean diet ? Recommended  Grains: Whole-grain pasta. Brown rice. Bulgar wheat. Polenta. Couscous. Whole-wheat bread. Orpah Cobb.  Vegetables: Artichokes. Beets. Broccoli. Cabbage. Carrots. Eggplant. Green beans. Chard. Kale. Spinach. Onions. Leeks. Peas. Squash. Tomatoes. Peppers. Radishes.  Fruits: Apples. Apricots. Avocado. Berries. Bananas. Cherries. Dates. Figs. Grapes. Lemons. Melon. Oranges. Peaches. Plums. Pomegranate.  Meats and other protein foods: Beans. Almonds. Sunflower seeds. Pine nuts. Peanuts.  Cod. Salmon. Scallops. Shrimp. Tuna. Tilapia. Clams. Oysters. Eggs.  Dairy: Low-fat milk. Cheese. Greek yogurt.  Beverages: Water. Red wine. Herbal tea.  Fats and oils: Extra virgin olive oil. Avocado oil. Grape seed oil.  Sweets and desserts: [place] yogurt with honey. Baked apples. Poached pears. Trail mix.  Seasoning and other foods: Basil. Cilantro. Coriander. Cumin. Mint. Parsley. Sage. Rosemary. Tarragon. Garlic. Oregano. Thyme. Pepper. Balsalmic vinegar. Tahini. Hummus. Tomato sauce. Olives. Mushrooms. ? Limit these  Grains: Prepackaged pasta or rice dishes. Prepackaged cereal with added sugar.  Vegetables: Deep fried potatoes (french fries).  Fruits: Fruit canned in syrup.  Meats and other protein foods: Beef. Pork. Lamb. Poultry with skin. Hot dogs. Tomasa Blase.  Dairy: Ice cream. Sour cream. Whole milk.  Beverages: Juice. Sugar-sweetened soft drinks. Beer. Liquor and spirits.  Fats and oils: Butter. Canola oil. Vegetable oil. Beef fat (tallow). Lard.  Sweets and desserts: Cookies. Cakes. Pies. Candy.  Seasoning and other foods: Mayonnaise. Premade sauces and marinades. The items listed may not be a complete list. Talk with your dietitian about what dietary choices are right for you. Summary  The Mediterranean diet includes both food and lifestyle choices.  Eat a variety of fresh fruits and vegetables, beans, nuts, seeds, and whole grains.  Limit the amount of red meat and sweets that you eat.  Talk with your health care provider about whether it is safe for you to drink red wine in moderation. This means 1 glass a day for nonpregnant women and 2 glasses a day for men. A glass of wine equals 5 oz (150 mL). This information is not intended to replace advice given to you by your health care provider. Make sure you discuss any questions you have with your health care provider. Document Revised: 06/12/2016 Document Reviewed: 06/05/2016 Elsevier Patient Education  2020 [employer].      Signed, Meriam Sprague, MD  12/17/2020 3:18 PM    [facility]

## 2020-12-17 ENCOUNTER — Encounter: Payer: Self-pay | Admitting: Cardiology

## 2020-12-17 ENCOUNTER — Other Ambulatory Visit: Payer: Self-pay

## 2020-12-17 ENCOUNTER — Ambulatory Visit (INDEPENDENT_AMBULATORY_CARE_PROVIDER_SITE_OTHER): Payer: BC Managed Care – PPO | Admitting: Cardiology

## 2020-12-17 ENCOUNTER — Telehealth: Payer: Self-pay | Admitting: Pharmacist

## 2020-12-17 VITALS — BP 160/92 | HR 65 | Ht 71.0 in | Wt 293.0 lb

## 2020-12-17 DIAGNOSIS — I10 Essential (primary) hypertension: Secondary | ICD-10-CM

## 2020-12-17 MED ORDER — CARVEDILOL 6.25 MG PO TABS: 6.2500 mg | ORAL_TABLET | Freq: Two times a day (BID) | ORAL | 3 refills | Status: AC

## 2020-12-17 NOTE — Telephone Encounter (Signed)
Prior authorization for [facility] submitted to insurance per Dr. Devin Going request. Awaiting response for insurance.

## 2020-12-17 NOTE — Patient Instructions (Signed)
Medication Instructions:  Your physician has recommended you make the following change in your medication:  1.  INCREASE the Coreg to 6.25 taking 1 twice a day   *If you need a refill on your cardiac medications before your next appointment, please call your pharmacy*   Lab Work: None ordered  If you have labs (blood work) drawn today and your tests are completely normal, you will receive your results only by: Marland Kitchen MyChart Message (if you have MyChart) OR . A paper copy in the mail If you have any lab test that is abnormal or we need to change your treatment, we will call you to review the results.   Testing/Procedures: None ordered   Follow-Up: At [facility], you and your health needs are our priority.  As part of our continuing mission to provide you with exceptional heart care, we have created designated Provider Care Teams.  These Care Teams include your primary Cardiologist (physician) and Advanced Practice Providers (APPs -  Physician Assistants and Nurse Practitioners) who all work together to provide you with the care you need, when you need it.  We recommend signing up for the patient portal called "MyChart".  Sign up information is provided on this After Visit Summary.  MyChart is used to connect with patients for Virtual Visits (Telemedicine).  Patients are able to view lab/test results, encounter notes, upcoming appointments, etc.  Non-urgent messages can be sent to your provider as well.   To learn more about what you can do with MyChart, go to [email or web address].    Your next appointment:   3 month(s)  The format for your next appointment:   In Person  Provider:   Laurance Flatten, MD   Other Instructions  [email or web address]">  DASH Eating Plan DASH stands for Dietary Approaches to Stop Hypertension. The DASH eating plan is a healthy eating plan that has been shown to:  Reduce high blood pressure  (hypertension).  Reduce your risk for type 2 diabetes, heart disease, and stroke.  Help with weight loss. What are tips for following this plan? Reading food labels  Check food labels for the amount of salt (sodium) per serving. Choose foods with less than 5 percent of the Daily Value of sodium. Generally, foods with less than 300 milligrams (mg) of sodium per serving fit into this eating plan.  To find whole grains, look for the word "whole" as the first word in the ingredient list. Shopping  Buy products labeled as "low-sodium" or "no salt added."  Buy fresh foods. Avoid canned foods and pre-made or frozen meals. Cooking  Avoid adding salt when cooking. Use salt-free seasonings or herbs instead of table salt or sea salt. Check with your health care provider or pharmacist before using salt substitutes.  Do not fry foods. Cook foods using healthy methods such as baking, boiling, grilling, roasting, and broiling instead.  Cook with heart-healthy oils, such as olive, canola, avocado, soybean, or sunflower oil. Meal planning  Eat a balanced diet that includes: ? 4 or more servings of fruits and 4 or more servings of vegetables each day. Try to fill one-half of your plate with fruits and vegetables. ? 6-8 servings of whole grains each day. ? Less than 6 oz (170 g) of lean meat, poultry, or fish each day. A 3-oz (85-g) serving of meat is about the same size as a deck of cards. One egg equals 1 oz (28 g). ? 2-3 servings of low-fat dairy each day. One serving is 1 cup (  237 mL). ? 1 serving of nuts, seeds, or beans 5 times each week. ? 2-3 servings of heart-healthy fats. Healthy fats called omega-3 fatty acids are found in foods such as walnuts, flaxseeds, fortified milks, and eggs. These fats are also found in cold-water fish, such as sardines, salmon, and mackerel.  Limit how much you eat of: ? Canned or prepackaged foods. ? Food that is high in trans fat, such as some fried  foods. ? Food that is high in saturated fat, such as fatty meat. ? Desserts and other sweets, sugary drinks, and other foods with added sugar. ? Full-fat dairy products.  Do not salt foods before eating.  Do not eat more than 4 egg yolks a week.  Try to eat at least 2 vegetarian meals a week.  Eat more home-cooked food and less restaurant, buffet, and fast food.   Lifestyle  When eating at a restaurant, ask that your food be prepared with less salt or no salt, if possible.  If you drink alcohol: ? Limit how much you use to:  0-1 drink a day for women who are not pregnant.  0-2 drinks a day for men. ? Be aware of how much alcohol is in your drink. In the [place] one drink equals one 12 oz bottle of beer (355 mL), one 5 oz glass of wine (148 mL), or one 1 oz glass of hard liquor (44 mL). General information  Avoid eating more than 2,300 mg of salt a day. If you have hypertension, you may need to reduce your sodium intake to 1,500 mg a day.  Work with your health care provider to maintain a healthy body weight or to lose weight. Ask what an ideal weight is for you.  Get at least 30 minutes of exercise that causes your heart to beat faster (aerobic exercise) most days of the week. Activities may include walking, swimming, or biking.  Work with your health care provider or dietitian to adjust your eating plan to your individual calorie needs. What foods should I eat? Fruits All fresh, dried, or frozen fruit. Canned fruit in natural juice (without added sugar). Vegetables Fresh or frozen vegetables (raw, steamed, roasted, or grilled). Low-sodium or reduced-sodium tomato and vegetable juice. Low-sodium or reduced-sodium tomato sauce and tomato paste. Low-sodium or reduced-sodium canned vegetables. Grains Whole-grain or whole-wheat bread. Whole-grain or whole-wheat pasta. Brown rice. Orpah Cobbatmeal. Quinoa. Bulgur. Whole-grain and low-sodium cereals. Pita bread. Low-fat, low-sodium crackers.  Whole-wheat flour tortillas. Meats and other proteins Skinless chicken or [place]. Ground chicken or [place]. Pork with fat trimmed off. Fish and seafood. Egg whites. Dried beans, peas, or lentils. Unsalted nuts, nut butters, and seeds. Unsalted canned beans. Lean cuts of beef with fat trimmed off. Low-sodium, lean precooked or cured meat, such as sausages or meat loaves. Dairy Low-fat (1%) or fat-free (skim) milk. Reduced-fat, low-fat, or fat-free cheeses. Nonfat, low-sodium ricotta or cottage cheese. Low-fat or nonfat yogurt. Low-fat, low-sodium cheese. Fats and oils Soft margarine without trans fats. Vegetable oil. Reduced-fat, low-fat, or light mayonnaise and salad dressings (reduced-sodium). Canola, safflower, olive, avocado, soybean, and sunflower oils. Avocado. Seasonings and condiments Herbs. Spices. Seasoning mixes without salt. Other foods Unsalted popcorn and pretzels. Fat-free sweets. The items listed above may not be a complete list of foods and beverages you can eat. Contact a dietitian for more information. What foods should I avoid? Fruits Canned fruit in a light or heavy syrup. Fried fruit. Fruit in cream or butter sauce. Vegetables Creamed or fried vegetables.  Vegetables in a cheese sauce. Regular canned vegetables (not low-sodium or reduced-sodium). Regular canned tomato sauce and paste (not low-sodium or reduced-sodium). Regular tomato and vegetable juice (not low-sodium or reduced-sodium). Rosita Fire. Olives. Grains Baked goods made with fat, such as croissants, muffins, or some breads. Dry pasta or rice meal packs. Meats and other proteins Fatty cuts of meat. Ribs. Fried meat. Tomasa Blase. Bologna, salami, and other precooked or cured meats, such as sausages or meat loaves. Fat from the back of a pig (fatback). Bratwurst. Salted nuts and seeds. Canned beans with added salt. Canned or smoked fish. Whole eggs or egg yolks. Chicken or [place] with skin. Dairy Whole or 2% milk, cream, and  half-and-half. Whole or full-fat cream cheese. Whole-fat or sweetened yogurt. Full-fat cheese. Nondairy creamers. Whipped toppings. Processed cheese and cheese spreads. Fats and oils Butter. Stick margarine. Lard. Shortening. Ghee. Bacon fat. Tropical oils, such as coconut, palm kernel, or palm oil. Seasonings and condiments Onion salt, garlic salt, seasoned salt, table salt, and sea salt. Worcestershire sauce. Tartar sauce. Barbecue sauce. Teriyaki sauce. Soy sauce, including reduced-sodium. Steak sauce. Canned and packaged gravies. Fish sauce. Oyster sauce. Cocktail sauce. Store-bought horseradish. Ketchup. Mustard. Meat flavorings and tenderizers. Bouillon cubes. Hot sauces. Pre-made or packaged marinades. Pre-made or packaged taco seasonings. Relishes. Regular salad dressings. Other foods Salted popcorn and pretzels. The items listed above may not be a complete list of foods and beverages you should avoid. Contact a dietitian for more information. Where to find more information  National Heart, Lung, and Blood Institute: PopSteam.is  American Heart Association: www.heart.org  Academy of Nutrition and Dietetics: www.eatright.org  National Kidney Foundation: www.kidney.org Summary  The DASH eating plan is a healthy eating plan that has been shown to reduce high blood pressure (hypertension). It may also reduce your risk for type 2 diabetes, heart disease, and stroke.  When on the DASH eating plan, aim to eat more fresh fruits and vegetables, whole grains, lean proteins, low-fat dairy, and heart-healthy fats.  With the DASH eating plan, you should limit salt (sodium) intake to 2,300 mg a day. If you have hypertension, you may need to reduce your sodium intake to 1,500 mg a day.  Work with your health care provider or dietitian to adjust your eating plan to your individual calorie needs. This information is not intended to replace advice given to you by your health care provider. Make  sure you discuss any questions you have with your health care provider. Document Revised: 09/16/2019 Document Reviewed: 09/16/2019 Elsevier Patient Education  2021 Elsevier Inc.    Mediterranean Diet A Mediterranean diet refers to food and lifestyle choices that are based on the traditions of countries located on the [place]. This way of eating has been shown to help prevent certain conditions and improve outcomes for people who have chronic diseases, like kidney disease and heart disease. What are tips for following this plan? Lifestyle  Cook and eat meals together with your family, when possible.  Drink enough fluid to keep your urine clear or pale yellow.  Be physically active every day. This includes: ? Aerobic exercise like running or swimming. ? Leisure activities like gardening, walking, or housework.  Get 7-8 hours of sleep each night.  If recommended by your health care provider, drink red wine in moderation. This means 1 glass a day for nonpregnant women and 2 glasses a day for men. A glass of wine equals 5 oz (150 mL). Reading food labels  Check the serving size of  packaged foods. For foods such as rice and pasta, the serving size refers to the amount of cooked product, not dry.  Check the total fat in packaged foods. Avoid foods that have saturated fat or trans fats.  Check the ingredients list for added sugars, such as corn syrup.   Shopping  At the grocery store, buy most of your food from the areas near the walls of the store. This includes: ? Fresh fruits and vegetables (produce). ? Grains, beans, nuts, and seeds. Some of these may be available in unpackaged forms or large amounts (in bulk). ? Fresh seafood. ? Poultry and eggs. ? Low-fat dairy products.  Buy whole ingredients instead of prepackaged foods.  Buy fresh fruits and vegetables in-season from local farmers markets.  Buy frozen fruits and vegetables in resealable bags.  If you do not  have access to quality fresh seafood, buy precooked frozen shrimp or canned fish, such as tuna, salmon, or sardines.  Buy small amounts of raw or cooked vegetables, salads, or olives from the deli or salad bar at your store.  Stock your pantry so you always have certain foods on hand, such as olive oil, canned tuna, canned tomatoes, rice, pasta, and beans. Cooking  Cook foods with extra-virgin olive oil instead of using butter or other vegetable oils.  Have meat as a side dish, and have vegetables or grains as your main dish. This means having meat in small portions or adding small amounts of meat to foods like pasta or stew.  Use beans or vegetables instead of meat in common dishes like chili or lasagna.  Experiment with different cooking methods. Try roasting or broiling vegetables instead of steaming or sauteing them.  Add frozen vegetables to soups, stews, pasta, or rice.  Add nuts or seeds for added healthy fat at each meal. You can add these to yogurt, salads, or vegetable dishes.  Marinate fish or vegetables using olive oil, lemon juice, garlic, and fresh herbs. Meal planning  Plan to eat 1 vegetarian meal one day each week. Try to work up to 2 vegetarian meals, if possible.  Eat seafood 2 or more times a week.  Have healthy snacks readily available, such as: ? Vegetable sticks with hummus. ? [place] yogurt. ? Fruit and nut trail mix.  Eat balanced meals throughout the week. This includes: ? Fruit: 2-3 servings a day ? Vegetables: 4-5 servings a day ? Low-fat dairy: 2 servings a day ? Fish, poultry, or lean meat: 1 serving a day ? Beans and legumes: 2 or more servings a week ? Nuts and seeds: 1-2 servings a day ? Whole grains: 6-8 servings a day ? Extra-virgin olive oil: 3-4 servings a day  Limit red meat and sweets to only a few servings a month   What are my food choices?  Mediterranean diet ? Recommended  Grains: Whole-grain pasta. Brown rice. Bulgar wheat.  Polenta. Couscous. Whole-wheat bread. Orpah Cobb.  Vegetables: Artichokes. Beets. Broccoli. Cabbage. Carrots. Eggplant. Green beans. Chard. Kale. Spinach. Onions. Leeks. Peas. Squash. Tomatoes. Peppers. Radishes.  Fruits: Apples. Apricots. Avocado. Berries. Bananas. Cherries. Dates. Figs. Grapes. Lemons. Melon. Oranges. Peaches. Plums. Pomegranate.  Meats and other protein foods: Beans. Almonds. Sunflower seeds. Pine nuts. Peanuts. Cod. Salmon. Scallops. Shrimp. Tuna. Tilapia. Clams. Oysters. Eggs.  Dairy: Low-fat milk. Cheese. Greek yogurt.  Beverages: Water. Red wine. Herbal tea.  Fats and oils: Extra virgin olive oil. Avocado oil. Grape seed oil.  Sweets and desserts: [place] yogurt with honey. Baked apples. Poached  pears. Trail mix.  Seasoning and other foods: Basil. Cilantro. Coriander. Cumin. Mint. Parsley. Sage. Rosemary. Tarragon. Garlic. Oregano. Thyme. Pepper. Balsalmic vinegar. Tahini. Hummus. Tomato sauce. Olives. Mushrooms. ? Limit these  Grains: Prepackaged pasta or rice dishes. Prepackaged cereal with added sugar.  Vegetables: Deep fried potatoes (french fries).  Fruits: Fruit canned in syrup.  Meats and other protein foods: Beef. Pork. Lamb. Poultry with skin. Hot dogs. Tomasa Blase.  Dairy: Ice cream. Sour cream. Whole milk.  Beverages: Juice. Sugar-sweetened soft drinks. Beer. Liquor and spirits.  Fats and oils: Butter. Canola oil. Vegetable oil. Beef fat (tallow). Lard.  Sweets and desserts: Cookies. Cakes. Pies. Candy.  Seasoning and other foods: Mayonnaise. Premade sauces and marinades. The items listed may not be a complete list. Talk with your dietitian about what dietary choices are right for you. Summary  The Mediterranean diet includes both food and lifestyle choices.  Eat a variety of fresh fruits and vegetables, beans, nuts, seeds, and whole grains.  Limit the amount of red meat and sweets that you eat.  Talk with your health care provider about  whether it is safe for you to drink red wine in moderation. This means 1 glass a day for nonpregnant women and 2 glasses a day for men. A glass of wine equals 5 oz (150 mL). This information is not intended to replace advice given to you by your health care provider. Make sure you discuss any questions you have with your health care provider. Document Revised: 06/12/2016 Document Reviewed: 06/05/2016 Elsevier Patient Education  2020 [employer].

## 2020-12-18 ENCOUNTER — Encounter (HOSPITAL_COMMUNITY): Payer: Self-pay | Admitting: Physical Therapy

## 2020-12-18 ENCOUNTER — Ambulatory Visit (HOSPITAL_COMMUNITY): Payer: Worker's Compensation | Admitting: Physical Therapy

## 2020-12-18 DIAGNOSIS — S81802D Unspecified open wound, left lower leg, subsequent encounter: Secondary | ICD-10-CM

## 2020-12-18 DIAGNOSIS — M79662 Pain in left lower leg: Secondary | ICD-10-CM | POA: Diagnosis not present

## 2020-12-18 NOTE — Telephone Encounter (Signed)
[facility] authorization has been denied - weight loss medications are not covered under his pharmacy benefits. Pt has normal A1c of 5.4 so Ozempic would not be approved either.

## 2020-12-18 NOTE — Therapy (Signed)
[facility] [place], [place], [address] Phone: [phone]   Fax:  [phone]  Wound Care Therapy  Patient Details  Name: Bruce Cruz MRN: [id number] Date of Birth: Jun 13, 1971 Referring Provider (PT): Junious Dresser   Encounter Date: 12/18/2020   PT End of Session - 12/18/20 0918    Visit Number 22    Number of Visits 27    Date for PT Re-Evaluation 12/28/20    Authorization Type work comp - 6 visits approved initally, inquiring about additional visits - per Izora Gala    Authorization - Visit Number 21    Progress Note Due on Visit 27    PT Start Time 0830    PT Stop Time 0908    PT Time Calculation (min) 38 min    Activity Tolerance Patient tolerated treatment well    Behavior During Therapy [facility] for tasks assessed/performed           Past Medical History:  Diagnosis Date  . Hypertension   . Multiple vitamin deficiency   . Obesity   . OSA (obstructive sleep apnea)     History reviewed. No pertinent surgical history.  There were no vitals filed for this visit.               Wound Therapy - 12/18/20 0001    Subjective Pt has no complaint no pain.    Patient and Family Stated Goals wound to heal    Date of Onset 10/11/20    Prior Treatments self care ,(changing dressing 2x a day, antibiotics, IV antibiotics, MD debridemetn.    Faces Pain Scale No hurt    Evaluation and Treatment Procedures Explained to Patient/Family Yes    Evaluation and Treatment Procedures agreed to    Wound Properties Date First Assessed: 10/24/20 Time First Assessed: 1450 Location: Pretibial Location Orientation: Left;Proximal Wound Description (Comments): adherent eschar covering wound Present on Admission: Yes   Dressing Type Compression wrap    Dressing Changed Changed    Dressing Status Old drainage    Dressing Change Frequency PRN    Site / Wound Assessment Granulation tissue;Yellow    % Wound base Red or Granulating  100%   after debridement ; 90 % prior to debridement   % Wound base Yellow/Fibrinous Exudate 0%    Peri-wound Assessment Intact;Edema    Wound Length (cm) 2.4 cm    Wound Width (cm) 1.1 cm    Wound Depth (cm) 0.2 cm   only at superior aspect at this point distal wound has no depth   Wound Volume (cm^3) 0.53 cm^3    Wound Surface Area (cm^2) 2.64 cm^2    Undermining (cm) 9:30 -12; .1 from 9-11:30; .2 at 12:00    Margins Attached edges (approximated)    Drainage Amount Minimal    Drainage Description Serosanguineous    Treatment Cleansed;Debridement (Selective)    Selective Debridement - Location wound bed    Selective Debridement - Tools Used Forceps;Scissors    Selective Debridement - Tissue Removed biofilm    Wound Therapy - Clinical Statement No maceration noted.  Wound bed had risen, distal aspect now has no depth, undermining has decreased.    Factors Delaying/Impairing Wound Healing Altered sensation    Hydrotherapy Plan Debridement;Dressing change;Patient/family education    Wound Therapy - Frequency 3X / week    Wound Therapy - Current Recommendations PT    Wound Plan Decrease to 2x a week , next session bandage with just kerlix  and coban; Monday try just kerlix to anticipate discharge next week to self care.    Dressing  cleanse, moisturized, xeroform, profore lite.                     PT Short Term Goals - 12/07/20 1403      PT SHORT TERM GOAL #1   Title Pt pain level to decrease to no greater than a 1/10 to allow pt to be able to freely walk without increased pain.    Time 2    Period Weeks    Status Achieved    Target Date 11/07/20      PT SHORT TERM GOAL #2   Title PT wounds to be 100% granulated to reduce risk of cellulitis.    Time 2    Period Weeks    Status Achieved             PT Long Term Goals - 12/07/20 1403      PT LONG TERM GOAL #1   Title Pt to have no pain in his Lt LE    Time 4    Period Weeks    Status Achieved      PT LONG  TERM GOAL #2   Title PT wound to be healed.    Time 4    Period Weeks    Status Partially Met                 Plan - 12/18/20 0919    Clinical Impression Statement see above    Examination-Activity Limitations Hygiene/Grooming    Examination-Participation Restrictions Occupation    Stability/Clinical Decision Making Stable/Uncomplicated    Rehab Potential Good    PT Frequency 3x / week    PT Duration --   to continue for 2 more weeks for a total of 6 weeks   PT Treatment/Interventions Other (comment)    PT Next Visit Plan Continue with debridement until slough has been removed then maintain a healthy environment conductive to wound healing    Consulted and Agree with Plan of Care Patient           Patient will benefit from skilled therapeutic intervention in order to improve the following deficits and impairments:  Pain,Decreased skin integrity,Increased edema  Visit Diagnosis: Open leg wound, left, subsequent encounter     Problem List Patient Active Problem List   Diagnosis Date Noted  . Cellulitis 11/03/2020    Rayetta Humphrey, PT CLT [phone] 12/18/2020, 9:20 AM  [facility] [address] [place], [place], [address] Phone: [phone]   Fax:  [phone]  Name: Bruce Cruz MRN: [id number] Date of Birth: May 06, 1971

## 2020-12-19 ENCOUNTER — Ambulatory Visit (HOSPITAL_COMMUNITY): Payer: Worker's Compensation | Admitting: Physical Therapy

## 2020-12-21 ENCOUNTER — Ambulatory Visit (HOSPITAL_COMMUNITY): Payer: Worker's Compensation | Admitting: Physical Therapy

## 2020-12-21 ENCOUNTER — Other Ambulatory Visit: Payer: Self-pay

## 2020-12-21 DIAGNOSIS — M79662 Pain in left lower leg: Secondary | ICD-10-CM | POA: Diagnosis not present

## 2020-12-21 DIAGNOSIS — S81802D Unspecified open wound, left lower leg, subsequent encounter: Secondary | ICD-10-CM

## 2020-12-21 NOTE — Therapy (Signed)
[place], [place], [address] Phone: [phone]   Fax:  [phone]  Wound Care Therapy  Patient Details  Name: Bruce Cruz MRN: [id number] Date of Birth: Dec 15, 1970 Referring Provider (PT): Junious Dresser   Encounter Date: 12/21/2020   PT End of Session - 12/21/20 1445    Visit Number 23    Number of Visits 27    Date for PT Re-Evaluation 12/28/20    Authorization Type work comp - 6 visits approved initally, inquiring about additional visits - per Izora Gala    Progress Note Due on Visit 27    PT Start Time 1350    PT Stop Time 1420    PT Time Calculation (min) 30 min    Activity Tolerance Patient tolerated treatment well    Behavior During Therapy [facility] for tasks assessed/performed           Past Medical History:  Diagnosis Date  . Hypertension   . Multiple vitamin deficiency   . Obesity   . OSA (obstructive sleep apnea)     No past surgical history on file.  There were no vitals filed for this visit.               Wound Therapy - 12/21/20 0001    Subjective PT states bandage slipped down    Patient and Family Stated Goals wound to heal    Date of Onset 10/11/20    Prior Treatments self care ,(changing dressing 2x a day, antibiotics, IV antibiotics, MD debridemetn.    Faces Pain Scale No hurt    Evaluation and Treatment Procedures Explained to Patient/Family Yes    Evaluation and Treatment Procedures agreed to    Wound Properties Date First Assessed: 10/24/20 Time First Assessed: 1450 Location: Pretibial Location Orientation: Left;Proximal Wound Description (Comments): adherent eschar covering wound Present on Admission: Yes   Dressing Type Bismuth petroleum;Compression wrap    Dressing Changed Changed    Dressing Status Old drainage    Dressing Change Frequency PRN    Site / Wound Assessment Granulation tissue    % Wound base Red or Granulating 100%   following debridement   %  Wound base Yellow/Fibrinous Exudate 0%    Peri-wound Assessment Intact    Wound Length (cm) 2.1 cm    Wound Width (cm) 1.1 cm    Wound Depth (cm) 0.2 cm   superior only   Wound Volume (cm^3) 0.46 cm^3    Wound Surface Area (cm^2) 2.31 cm^2    Drainage Amount Minimal    Drainage Description Serous    Treatment Cleansed;Debridement (Selective)    Selective Debridement - Location wound bed    Selective Debridement - Tools Used Forceps    Selective Debridement - Tissue Removed biofilm    Wound Therapy - Clinical Statement Pt wound continues to approximate.  We will continue to decrease compression to see how wound will respond to this.  If wound is able to be 100% granulated with cleansing and is responding well to minimal bandaging we can d/c to self care at the end of next week.    Factors Delaying/Impairing Wound Healing Altered sensation    Hydrotherapy Plan Debridement;Dressing change;Patient/family education    Wound Therapy - Frequency 2X / week    Wound Therapy - Current Recommendations PT    Wound Plan This  session bandage with just kerlix and coban; Monday try just kerlix to anticipate discharge next week to self care.  Dressing  cleanse, moisturized, xeroform, kerlix and coban.                     PT Short Term Goals - 12/07/20 1403      PT SHORT TERM GOAL #1   Title Pt pain level to decrease to no greater than a 1/10 to allow pt to be able to freely walk without increased pain.    Time 2    Period Weeks    Status Achieved    Target Date 11/07/20      PT SHORT TERM GOAL #2   Title PT wounds to be 100% granulated to reduce risk of cellulitis.    Time 2    Period Weeks    Status Achieved             PT Long Term Goals - 12/07/20 1403      PT LONG TERM GOAL #1   Title Pt to have no pain in his Lt LE    Time 4    Period Weeks    Status Achieved      PT LONG TERM GOAL #2   Title PT wound to be healed.    Time 4    Period Weeks    Status Partially  Met                 Plan - 12/21/20 1446    Clinical Impression Statement see above    Examination-Activity Limitations Hygiene/Grooming    Examination-Participation Restrictions Occupation    Stability/Clinical Decision Making Stable/Uncomplicated    Rehab Potential Good    PT Frequency 3x / week    PT Duration --   to continue for 2 more weeks for a total of 6 weeks   PT Treatment/Interventions Other (comment)    PT Next Visit Plan Continue with debridement until slough has been removed then maintain a healthy environment conductive to wound healing    Consulted and Agree with Plan of Care Patient           Patient will benefit from skilled therapeutic intervention in order to improve the following deficits and impairments:  Pain,Decreased skin integrity,Increased edema  Visit Diagnosis: Open leg wound, left, subsequent encounter     Problem List Patient Active Problem List   Diagnosis Date Noted  . Cellulitis 11/03/2020    Rayetta Humphrey, PT CLT [phone] 12/21/2020, 2:47 PM  [place] [address] [place], [place], [address] Phone: [phone]   Fax:  [phone]  Name: SAYVION VIGEN MRN: [id number] Date of Birth: 1971/01/04

## 2020-12-24 ENCOUNTER — Other Ambulatory Visit: Payer: Self-pay

## 2020-12-24 ENCOUNTER — Encounter (HOSPITAL_COMMUNITY): Payer: Self-pay | Admitting: Physical Therapy

## 2020-12-24 ENCOUNTER — Ambulatory Visit (HOSPITAL_COMMUNITY): Payer: Worker's Compensation | Admitting: Physical Therapy

## 2020-12-24 DIAGNOSIS — S81802D Unspecified open wound, left lower leg, subsequent encounter: Secondary | ICD-10-CM

## 2020-12-24 DIAGNOSIS — M79662 Pain in left lower leg: Secondary | ICD-10-CM

## 2020-12-24 NOTE — Therapy (Signed)
[place], [place], [address] Phone: [phone]   Fax:  [phone]  Wound Care Therapy  Patient Details  Name: Bruce Cruz MRN: [id number] Date of Birth: 1971-06-07 Referring Provider (PT): Junious Dresser   Encounter Date: 12/24/2020   PT End of Session - 12/24/20 0858    Visit Number 24    Number of Visits 27    Date for PT Re-Evaluation 12/28/20    Authorization Type work comp - 6 visits approved initally, inquiring about additional visits - per Izora Gala    Authorization - Visit Number 24    Progress Note Due on Visit 27    PT Start Time 0830    PT Stop Time 7793    PT Time Calculation (min) 12 min    Activity Tolerance Patient tolerated treatment well    Behavior During Therapy [facility] for tasks assessed/performed           Past Medical History:  Diagnosis Date  . Hypertension   . Multiple vitamin deficiency   . Obesity   . OSA (obstructive sleep apnea)     History reviewed. No pertinent surgical history.  There were no vitals filed for this visit.               Wound Therapy - 12/24/20 0001    Subjective PT states no complaints    Patient and Family Stated Goals wound to heal    Date of Onset 10/11/20    Prior Treatments self care ,(changing dressing 2x a day, antibiotics, IV antibiotics, MD debridemetn.    Faces Pain Scale No hurt    Evaluation and Treatment Procedures Explained to Patient/Family Yes    Evaluation and Treatment Procedures agreed to    Wound Properties Date First Assessed: 10/24/20 Time First Assessed: 1450 Location: Pretibial Location Orientation: Left;Proximal Wound Description (Comments): adherent eschar covering wound Present on Admission: Yes   Dressing Type Bismuth petroleum    Dressing Changed Changed    Dressing Status Clean    Dressing Change Frequency PRN    Site / Wound Assessment Granulation tissue    % Wound base Red or Granulating 100%   after  debridement ; 95% prior   % Wound base Yellow/Fibrinous Exudate 0%    Peri-wound Assessment Intact    Drainage Amount Scant    Drainage Description Serous    Treatment Cleansed;Debridement (Selective)    Selective Debridement - Location wound bed    Selective Debridement - Tools Used Forceps    Selective Debridement - Tissue Removed biofilm    Wound Therapy - Clinical Statement Wound continues to approximate and improve in depth.  Trial until Friday of pt showering daily and dressing wound with xeroform and large bandaid.  If wound continues to look good on Friday we will discharge to self care.    Factors Delaying/Impairing Wound Healing Altered sensation    Hydrotherapy Plan Debridement;Dressing change;Patient/family education    Wound Therapy - Frequency 2X / week    Wound Therapy - Current Recommendations PT    Wound Plan Possible discharge to self care next treatment.    Dressing  cleanse, moisturized, xeroform, kerlix and coban.                     PT Short Term Goals - 12/07/20 1403      PT SHORT TERM GOAL #1   Title Pt pain level to decrease to no greater than a 1/10 to  allow pt to be able to freely walk without increased pain.    Time 2    Period Weeks    Status Achieved    Target Date 11/07/20      PT SHORT TERM GOAL #2   Title PT wounds to be 100% granulated to reduce risk of cellulitis.    Time 2    Period Weeks    Status Achieved             PT Long Term Goals - 12/07/20 1403      PT LONG TERM GOAL #1   Title Pt to have no pain in his Lt LE    Time 4    Period Weeks    Status Achieved      PT LONG TERM GOAL #2   Title PT wound to be healed.    Time 4    Period Weeks    Status Partially Met                 Plan - 12/24/20 0859    Clinical Impression Statement see above    Examination-Activity Limitations Hygiene/Grooming    Examination-Participation Restrictions Occupation    Stability/Clinical Decision Making  Stable/Uncomplicated    Rehab Potential Good    PT Frequency 3x / week    PT Duration --   to continue for 2 more weeks for a total of 6 weeks   PT Treatment/Interventions Other (comment)    PT Next Visit Plan Possible D/C next treatment.    Consulted and Agree with Plan of Care Patient           Patient will benefit from skilled therapeutic intervention in order to improve the following deficits and impairments:  Pain,Decreased skin integrity,Increased edema  Visit Diagnosis: Open leg wound, left, subsequent encounter  Pain in left lower leg     Problem List Patient Active Problem List   Diagnosis Date Noted  . Cellulitis 11/03/2020    Rayetta Humphrey, PT CLT [phone] 12/24/2020, 9:01 AM  [place] [address] [place], [place], [address] Phone: [phone]   Fax:  [phone]  Name: Bruce Cruz MRN: [id number] Date of Birth: [birth year]

## 2020-12-26 ENCOUNTER — Ambulatory Visit (HOSPITAL_COMMUNITY): Payer: BC Managed Care – PPO | Admitting: Physical Therapy

## 2020-12-28 ENCOUNTER — Telehealth (HOSPITAL_COMMUNITY): Payer: Self-pay | Admitting: Physical Therapy

## 2020-12-28 ENCOUNTER — Ambulatory Visit (HOSPITAL_COMMUNITY): Payer: BC Managed Care – PPO

## 2020-12-28 NOTE — Telephone Encounter (Signed)
Per w/c patient states he will need to be d/c from out office today - they are sending him  to another location.

## 2021-01-01 ENCOUNTER — Telehealth (HOSPITAL_COMMUNITY): Payer: Self-pay | Admitting: Physical Therapy

## 2021-01-01 NOTE — Telephone Encounter (Signed)
PHYSICAL THERAPY DISCHARGE SUMMARY  Visits from Start of Care: 24  Current functional level related to goals / functional outcomes: Wound is granulated and filled in    Remaining deficits: Not totally healed but able to be treated by self care at this point   Education / Equipment: Self care.  Plan: Patient agrees to discharge.  Patient goals were partially met. Patient is being discharged due to meeting the stated rehab goals.  ?????    Rayetta Humphrey, [place] CLT [phone]

## 2021-01-03 DIAGNOSIS — G4733 Obstructive sleep apnea (adult) (pediatric): Secondary | ICD-10-CM | POA: Diagnosis not present

## 2021-02-01 ENCOUNTER — Encounter (HOSPITAL_COMMUNITY): Payer: Self-pay | Admitting: Physical Therapy

## 2021-02-12 ENCOUNTER — Ambulatory Visit: Payer: BC Managed Care – PPO | Admitting: Cardiology

## 2021-03-15 NOTE — Telephone Encounter (Signed)
Dr. Shari Prows, pt is just sending you BP recordings periodically as indicated in last set he sent in mychart back in March.  Just wanting to keep you posted.

## 2021-03-16 NOTE — Progress Notes (Deleted)
Cardiology Office Note:    Date:  03/16/2021   ID:  Bruce Cruz, DOB 1971-10-07, MRN [id number]  PCP:  Patient, No Pcp Per (Inactive)   [facility]  Cardiologist:  None  Advanced Practice Provider:  No care team member to display Electrophysiologist:  None    Referring MD: No ref. provider found    History of Present Illness:    Bruce Cruz is a 50 y.o. 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  During visit on 11/06/20, we changed his dilt to amlodipine 10, continued Olmesartan 40-HCTZ 25, started spironolactone 25mg  and coreg 3.125mg  BID.  During his last visit on 12/17/20, we increased his coreg to 6.25mg  BID. His regimen currently includes:  norvasc 10mg  at night, coreg 6.25mg  BID, Olmesartan 40-HCTZ 25mg  daily, aldactone 25mg  in the morning.   Today,  Past Medical History:  Diagnosis Date  . Hypertension   . Multiple vitamin deficiency   . Obesity   . OSA (obstructive sleep apnea)     No past surgical history on file.  Current Medications: No outpatient medications have been marked as taking for the 03/18/21 encounter (Appointment) with , MD.     Allergies:   Patient has no known allergies.   Social History   Socioeconomic History  . Marital status: Single    Spouse name: Not on file  . Number of children: Not on file  . Years of education: Not on file  . Highest education level: Not on file  Occupational History  . Not on file  Tobacco Use  . Smoking status: Current Every Day Smoker    Types: Cigars  . Smokeless tobacco: Never Used  Substance and Sexual Activity  . Alcohol use: Yes  . Drug use: Never  . Sexual activity: Not on file  Other Topics Concern  . Not on file  Social History Narrative  . Not on file   Social Determinants of Health   Financial Resource Strain: Not on file  Food  Insecurity: Not on file  Transportation Needs: Not on file  Physical Activity: Not on file  Stress: Not on file  Social Connections: Not on file     Family History: The patient's family history includes Heart attack (age of onset: 31) in his mother; Heart attack (age of onset: 60) in his father.  ROS:   Please see the history of present illness.    Review of Systems  Constitutional: Negative for chills and fever.  HENT: Negative for nosebleeds.   Eyes: Negative for blurred vision and redness.  Respiratory: Negative for shortness of breath.   Cardiovascular: Negative for chest pain, palpitations, orthopnea, claudication, leg swelling and PND.  Gastrointestinal: Negative for nausea and vomiting.  Genitourinary: Negative for dysuria.  Musculoskeletal: Negative for falls.  Neurological: Negative for dizziness and loss of consciousness.  Endo/Heme/Allergies: Negative for polydipsia.  Psychiatric/Behavioral: Negative for substance abuse.    EKGs/Labs/Other Studies Reviewed:    The following studies were reviewed today: No cardiac studies  Recent Labs: 10/22/2020: ALT 19; Hemoglobin 15.6; Platelets 304 11/16/2020: BUN 25; Creat 0.82; Potassium 4.2; Sodium 142  Recent Lipid Panel No results found for: CHOL, TRIG, HDL, CHOLHDL, VLDL, LDLCALC, LDLDIRECT    Physical Exam:    VS:  There were no vitals taken for this visit.    Wt Readings from Last 3 Encounters:  12/17/20 293 lb (132.9 kg)  11/06/20 294 lb (133.4 kg)  11/02/20 298 lb (135.2 kg)     GEN:  Well nourished, well developed in no acute distress HEENT: Normal NECK: No JVD; No carotid bruits CARDIAC: RRR, no murmurs, rubs, gallops RESPIRATORY:  Clear to auscultation without rales, wheezing or rhonchi  ABDOMEN: Soft, non-tender, non-distended MUSCULOSKELETAL:  No edema; LLE wound with dressing in place. SKIN: Warm and dry NEUROLOGIC:  Alert and oriented x 3 PSYCHIATRIC:  Normal affect   ASSESSMENT:    No  diagnosis found. PLAN:    In order of problems listed above:  #Resistent Hypertension: Improved but still elevated at the end of the day. Running mainly 110-140s at home. Has been compliant with all medications -Continue amlodipine 10mg  daily -Continue Olmesartan 40-HCTZ 25mg  daily -Continue spironolactone 25mg  daily -Continue coreg 6.25mg  BID -Keep BP log and send in readings -Counseled about low Na diet of <2g/day -Increase daily exercise  #Morbid Obesity with BMI 41 S/p Reux-en-Y gastric bypass. BMI 41.  -Encouarged healthy diet and exercise as detailed below -Will look into coverage for ozempic/wegovy  Exercise recommendations: Goal of exercising for at least 30 minutes a day, at least 5 times per week.  Please exercise to a moderate exertion.  This means that while exercising it is difficult to speak in full sentences, however you are not so short of breath that you feel you must stop, and not so comfortable that you can carry on a full conversation.  Exertion level should be approximately a 5/10, if 10 is the most exertion you can perform.  Diet recommendations: Recommend a heart healthy diet such as the Mediterranean diet.  This diet consists of plant based foods, healthy fats, lean meats, olive oil.  It suggests limiting the intake of simple carbohydrates such as white breads, pastries, and pastas.  It also limits the amount of red meat, wine, and dairy products such as cheese that one should consume on a daily basis.  #Risk Stratification: -LDL well controlled at 100 on 07/2020 -Weight loss as above -HgB A1C 5.4 -Given family history and risk factors, would benefit from coronary calcium score--discussed with patient and wants to defer for now   Medication Adjustments/Labs and Tests Ordered: Current medicines are reviewed at length with the patient today.  Concerns regarding medicines are outlined above.  No orders of the defined types were placed in this  encounter.  No orders of the defined types were placed in this encounter.   There are no Patient Instructions on file for this visit.   Signed, , MD  03/16/2021 3:13 PM    [facility]

## 2021-03-18 ENCOUNTER — Ambulatory Visit (INDEPENDENT_AMBULATORY_CARE_PROVIDER_SITE_OTHER): Payer: BC Managed Care – PPO | Admitting: Cardiology

## 2021-03-18 ENCOUNTER — Encounter: Payer: Self-pay | Admitting: Cardiology

## 2021-03-18 ENCOUNTER — Other Ambulatory Visit: Payer: Self-pay

## 2021-03-18 VITALS — BP 144/92 | HR 61 | Ht 71.0 in | Wt 293.2 lb

## 2021-03-18 DIAGNOSIS — I10 Essential (primary) hypertension: Secondary | ICD-10-CM | POA: Diagnosis not present

## 2021-03-18 NOTE — Patient Instructions (Signed)
Medication Instructions:   Your physician recommends that you continue on your current medications as directed. Please refer to the Current Medication list given to you today.  *If you need a refill on your cardiac medications before your next appointment, please call your pharmacy*   Follow-Up: At [facility], you and your health needs are our priority.  As part of our continuing mission to provide you with exceptional heart care, we have created designated Provider Care Teams.  These Care Teams include your primary Cardiologist (physician) and Advanced Practice Providers (APPs -  Physician Assistants and Nurse Practitioners) who all work together to provide you with the care you need, when you need it.  We recommend signing up for the patient portal called "MyChart".  Sign up information is provided on this After Visit Summary.  MyChart is used to connect with patients for Virtual Visits (Telemedicine).  Patients are able to view lab/test results, encounter notes, upcoming appointments, etc.  Non-urgent messages can be sent to your provider as well.   To learn more about what you can do with MyChart, go to [email or web address].    Your next appointment:   8 month(s)  The format for your next appointment:   In Person  Provider:   Laurance Flatten, MD    Other Instructions:  --Please continue monitoring, recording and sending your blood pressure readings to Dr. Shari Prows--

## 2021-03-18 NOTE — Progress Notes (Signed)
Cardiology Office Note:    Date:  03/18/2021   ID:  Bruce Cruz, DOB [birth year], MRN [id number]  PCP:  Assunta Found, MD   [facility]  Cardiologist:  None  Advanced Practice Provider:  No care team member to display Electrophysiologist:  None    Referring MD: No ref. provider found    History of Present Illness:    Bruce Cruz is a 50 y.o. 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  During visit on 11/06/20, we changed his dilt to amlodipine 10, continued Olmesartan 40-HCTZ 25, started spironolactone 25mg  and coreg 3.125mg  BID.  During his last visit on 12/17/20, we increased his coreg to 6.25mg  BID. His regimen currently includes:  norvasc 10mg  at night, coreg 6.25mg  BID, Olmesartan 40-HCTZ 25mg  daily, aldactone 25mg  in the morning.   Today, he is feeling okay. He does not feel any differently overall since his last visit, and has no new complaints. His at home blood pressure is averaging 110s-140s while he is working in the middle of the day. Overall, mainly running in 120s which is significantly improved. He is tolerating all his medications without issues.  For exercise he started walking twice a week for 2.5 miles. His weight remains stable and unchanged from previous visit.   He denies any chest pain, shortness of breath, palpitations, or exertional symptoms. No headaches, lightheadedness, or syncope to report. Also has no lower extremity edema, orthopnea or PND.  He is scheduled for updated blood work this Fall.   His mother had a heart attack at about 80 yo.  Past Medical History:  Diagnosis Date  . Hypertension   . Multiple vitamin deficiency   . Obesity   . OSA (obstructive sleep apnea)     No past surgical history on file.  Current Medications: Current Meds  Medication Sig  . Ascorbic Acid (VITAMIN C) 1000 MG tablet   . Biotin  10 MG CAPS Take by mouth.  . carvedilol (COREG) 6.25 MG tablet Take 1 tablet (6.25 mg total) by mouth 2 (two) times daily.  . Cholecalciferol (VITAMIN D3) 25 MCG (1000 UT) CAPS Take by mouth daily.  . Ferrous Sulfate (IRON PO) Take 25 mg by mouth daily.  Oil (OMEGA-3) 500 MG CAPS Take 500 mg by mouth daily.  . Magnesium Oxide 250 MG TABS Take by mouth daily.  . Multiple Vitamin (MULTIVITAMIN) capsule Take 1 capsule by mouth daily.  olmesartan-hydrochlorothiazide (BENICAR HCT) 40-25 MG tablet Take 1 tablet by mouth daily.  . tadalafil (CIALIS) 5 MG tablet Take 5 mg by mouth as needed for erectile dysfunction.  . Turmeric Curcumin 500 MG CAPS Take 1 capsule by mouth daily.  11-22-1968 zinc gluconate 50 MG tablet Take by mouth.     Allergies:   Patient has no known allergies.   Social History   Socioeconomic History  . Marital status: Single    Spouse name: Not on file  . Number of children: Not on file  . Years of education: Not on file  . Highest education level: Not on file  Occupational History  . Not on file  Tobacco Use  . Smoking status: Current Every Day Smoker    Types: Cigars  . Smokeless tobacco: Never Used  Substance and Sexual Activity  . Alcohol use: Yes  . Drug use: Never  . Sexual activity:  Not on file  Other Topics Concern  . Not on file  Social History Narrative  . Not on file   Social Determinants of Health   Financial Resource Strain: Not on file  Food Insecurity: Not on file  Transportation Needs: Not on file  Physical Activity: Not on file  Stress: Not on file  Social Connections: Not on file     Family History: The patient's family history includes Heart attack (age of onset: 67) in his mother; Heart attack (age of onset: 58) in his father.  ROS:   Please see the history of present illness.    Review of Systems  Constitutional: Negative for chills and fever.  HENT: Negative for nosebleeds.   Eyes: Negative for blurred vision and redness.   Respiratory: Negative for shortness of breath.   Cardiovascular: Negative for chest pain, palpitations, orthopnea, claudication, leg swelling and PND.  Gastrointestinal: Negative for nausea and vomiting.  Genitourinary: Negative for dysuria.  Musculoskeletal: Negative for falls.  Neurological: Negative for dizziness and loss of consciousness.  Endo/Heme/Allergies: Negative for polydipsia.  Psychiatric/Behavioral: Negative for substance abuse.    EKGs/Labs/Other Studies Reviewed:    The following studies were reviewed today: No cardiac studies  Recent Labs: 10/22/2020: ALT 19; Hemoglobin 15.6; Platelets 304 11/16/2020: BUN 25; Creat 0.82; Potassium 4.2; Sodium 142  Recent Lipid Panel No results found for: CHOL, TRIG, HDL, CHOLHDL, VLDL, LDLCALC, LDLDIRECT    Physical Exam:    VS:  BP (!) 144/92   Pulse 61   Ht 5\' 11"  (1.803 m)   Wt 293 lb 3.2 oz (133 kg)   SpO2 97%   BMI 40.89 kg/m     Wt Readings from Last 3 Encounters:  03/18/21 293 lb 3.2 oz (133 kg)  12/17/20 293 lb (132.9 kg)  11/06/20 294 lb (133.4 kg)     GEN:  Well nourished, well developed in no acute distress HEENT: Normal NECK: No JVD; No carotid bruits CARDIAC: RRR, no murmurs, rubs, gallops RESPIRATORY:  Clear to auscultation without rales, wheezing or rhonchi  ABDOMEN: Soft, non-tender, non-distended MUSCULOSKELETAL:  No edema; LLE wound with dressing in place. SKIN: Warm and dry NEUROLOGIC:  Alert and oriented x 3 PSYCHIATRIC:  Normal affect   ASSESSMENT:    1. Resistant hypertension   2. Morbid obesity (HCC)    PLAN:    In order of problems listed above:  #Resistent Hypertension: Significantly improved. Running mainly 110-140s at home. Has been compliant with all medications. Recording a daily BP log.  -Continue amlodipine 10mg  daily -Continue Olmesartan 40-HCTZ 25mg  daily -Continue spironolactone 25mg  daily -Continue coreg 6.25mg  BID -Keep BP log and send in readings every month   -Continue low Na diet of <2g/day -Continue weight loss efforts  #Morbid Obesity with BMI 41 S/p Reux-en-Y gastric bypass. BMI 41.  -Encouarged healthy diet and exercise as detailed below -Unfortunately, wegovy and ozempic were not covered  Exercise recommendations: Goal of exercising for at least 30 minutes a day, at least 5 times per week.  Please exercise to a moderate exertion.  This means that while exercising it is difficult to speak in full sentences, however you are not so short of breath that you feel you must stop, and not so comfortable that you can carry on a full conversation.  Exertion level should be approximately a 5/10, if 10 is the most exertion you can perform.  Diet recommendations: Recommend a heart healthy diet such as the Mediterranean diet.  This diet consists of plant  based foods, healthy fats, lean meats, olive oil.  It suggests limiting the intake of simple carbohydrates such as white breads, pastries, and pastas.  It also limits the amount of red meat, wine, and dairy products such as cheese that one should consume on a daily basis.  #Risk Stratification: -LDL well controlled at 75 -Weight loss as above -HgB A1C 5.4 -Given family history and risk factors, would benefit from coronary calcium score--discussed with patient and wants to defer for now until age 38  Follow-up in 8 months.  Medication Adjustments/Labs and Tests Ordered: Current medicines are reviewed at length with the patient today.  Concerns regarding medicines are outlined above.  No orders of the defined types were placed in this encounter.  No orders of the defined types were placed in this encounter.   Patient Instructions  Medication Instructions:   Your physician recommends that you continue on your current medications as directed. Please refer to the Current Medication list given to you today.  *If you need a refill on your cardiac medications before your next appointment, please  call your pharmacy*   Follow-Up: At [facility], you and your health needs are our priority.  As part of our continuing mission to provide you with exceptional heart care, we have created designated Provider Care Teams.  These Care Teams include your primary Cardiologist (physician) and Advanced Practice Providers (APPs -  Physician Assistants and Nurse Practitioners) who all work together to provide you with the care you need, when you need it.  We recommend signing up for the patient portal called "MyChart".  Sign up information is provided on this After Visit Summary.  MyChart is used to connect with patients for Virtual Visits (Telemedicine).  Patients are able to view lab/test results, encounter notes, upcoming appointments, etc.  Non-urgent messages can be sent to your provider as well.   To learn more about what you can do with MyChart, go to [email or web address].    Your next appointment:   8 month(s)  The format for your next appointment:   In Person  Provider:   Laurance Flatten, MD    Other Instructions:  --Please continue monitoring, recording and sending your blood pressure readings to Dr. Shari Prows--     [facility],acting as a scribe for Meriam Sprague, MD.,have documented all relevant documentation on the behalf of Meriam Sprague, MD,as directed by  Meriam Sprague, MD while in the presence of Meriam Sprague, MD.  I, Meriam Sprague, MD, have reviewed all documentation for this visit. The documentation on 03/18/21 for the exam, diagnosis, procedures, and orders are all accurate and complete.  Signed, Meriam Sprague, MD  03/18/2021 3:22 PM    [facility]

## 2021-03-19 NOTE — Telephone Encounter (Signed)
Pt saw Dr. Shari Prows in clinic yesterday.  These readings were discussed.  See OV note for further details.

## 2021-04-02 MED ORDER — OLMESARTAN MEDOXOMIL-HCTZ 40-25 MG PO TABS: 1.0000 | ORAL_TABLET | Freq: Every day | ORAL | 2 refills | Status: AC

## 2021-10-14 ENCOUNTER — Other Ambulatory Visit: Payer: Self-pay

## 2021-10-14 MED ORDER — SPIRONOLACTONE 25 MG PO TABS: 25.0000 mg | ORAL_TABLET | Freq: Every day | ORAL | 1 refills | Status: AC

## 2021-11-01 ENCOUNTER — Other Ambulatory Visit: Payer: Self-pay

## 2021-11-01 MED ORDER — AMLODIPINE BESYLATE 10 MG PO TABS: 10.0000 mg | ORAL_TABLET | Freq: Every day | ORAL | 1 refills | Status: AC

## 2021-11-05 DIAGNOSIS — Z6841 Body Mass Index (BMI) 40.0 and over, adult: Secondary | ICD-10-CM | POA: Diagnosis not present

## 2021-11-05 DIAGNOSIS — I10 Essential (primary) hypertension: Secondary | ICD-10-CM | POA: Diagnosis not present

## 2021-11-05 DIAGNOSIS — Z0000 Encounter for general adult medical examination without abnormal findings: Secondary | ICD-10-CM | POA: Diagnosis not present

## 2021-11-05 DIAGNOSIS — E559 Vitamin D deficiency, unspecified: Secondary | ICD-10-CM | POA: Diagnosis not present

## 2021-11-05 DIAGNOSIS — E538 Deficiency of other specified B group vitamins: Secondary | ICD-10-CM | POA: Diagnosis not present

## 2021-11-05 DIAGNOSIS — Z9229 Personal history of other drug therapy: Secondary | ICD-10-CM | POA: Diagnosis not present

## 2021-11-05 DIAGNOSIS — G4733 Obstructive sleep apnea (adult) (pediatric): Secondary | ICD-10-CM | POA: Diagnosis not present

## 2021-11-05 DIAGNOSIS — Z23 Encounter for immunization: Secondary | ICD-10-CM | POA: Diagnosis not present

## 2021-12-13 DIAGNOSIS — G4733 Obstructive sleep apnea (adult) (pediatric): Secondary | ICD-10-CM | POA: Diagnosis not present

## 2022-01-12 ENCOUNTER — Other Ambulatory Visit: Payer: Self-pay

## 2022-01-12 ENCOUNTER — Ambulatory Visit
Admission: EM | Admit: 2022-01-12 | Discharge: 2022-01-12 | Disposition: A | Discharge status: Home / Self Care | Payer: BC Managed Care – PPO | Attending: Urgent Care | Admitting: Urgent Care

## 2022-01-12 DIAGNOSIS — H9203 Otalgia, bilateral: Secondary | ICD-10-CM | POA: Diagnosis not present

## 2022-01-12 DIAGNOSIS — I10 Essential (primary) hypertension: Secondary | ICD-10-CM

## 2022-01-12 DIAGNOSIS — R051 Acute cough: Secondary | ICD-10-CM | POA: Diagnosis not present

## 2022-01-12 DIAGNOSIS — R0981 Nasal congestion: Secondary | ICD-10-CM | POA: Diagnosis not present

## 2022-01-12 DIAGNOSIS — F17200 Nicotine dependence, unspecified, uncomplicated: Secondary | ICD-10-CM

## 2022-01-12 DIAGNOSIS — J069 Acute upper respiratory infection, unspecified: Secondary | ICD-10-CM | POA: Diagnosis not present

## 2022-01-12 MED ORDER — BENZONATATE 100 MG PO CAPS: 100.0000 mg | ORAL_CAPSULE | Freq: Three times a day (TID) | ORAL | 0 refills | Status: DC | PRN

## 2022-01-12 MED ORDER — LEVOCETIRIZINE DIHYDROCHLORIDE 5 MG PO TABS: 5.0000 mg | ORAL_TABLET | Freq: Every evening | ORAL | 0 refills | Status: DC

## 2022-01-12 MED ORDER — PREDNISONE 20 MG PO TABS: ORAL_TABLET | ORAL | 0 refills | Status: DC

## 2022-01-12 NOTE — ED Provider Notes (Signed)
?[facility] ? ? ?MRN: [id number] DOB: August 12, 1971 ? ?Subjective:  ? ?Bruce Cruz is a 51 y.o. male presenting for 2-day history of acute onset persistent sinus congestion, bilateral ear pain, postnasal drainage, coughing, chest congestion and coughing fits.  No chest pain, shortness of breath or wheezing.  Patient is a smoker, does a few cigars daily.  No history of asthma.  He does have a history of hypertension, takes his medications consistently for this. ? ?No current facility-administered medications for this encounter. ? ?Current Outpatient Medications:  ?  amLODipine (NORVASC) 10 MG tablet, Take 1 tablet (10 mg total) by mouth daily., Disp: 90 tablet, Rfl: 1 ?  Ascorbic Acid (VITAMIN C) 1000 MG tablet, , Disp: , Rfl:  ?  Biotin 10 MG CAPS, Take by mouth., Disp: , Rfl:  ?  carvedilol (COREG) 6.25 MG tablet, Take 1 tablet (6.25 mg total) by mouth 2 (two) times daily., Disp: 180 tablet, Rfl: 3 ?  Cholecalciferol (VITAMIN D3) 25 MCG (1000 UT) CAPS, Take by mouth daily., Disp: , Rfl:  ?  Ferrous Sulfate (IRON PO), Take 25 mg by mouth daily., Disp: , Rfl:  ?  Krill Oil (OMEGA-3) 500 MG CAPS, Take 500 mg by mouth daily., Disp: , Rfl:  ?  Magnesium Oxide 250 MG TABS, Take by mouth daily., Disp: , Rfl:  ?  Multiple Vitamin (MULTIVITAMIN) capsule, Take 1 capsule by mouth daily., Disp: , Rfl:  ?  olmesartan-hydrochlorothiazide (BENICAR HCT) 40-25 MG tablet, Take 1 tablet by mouth daily., Disp: 90 tablet, Rfl: 2 ?  spironolactone (ALDACTONE) 25 MG tablet, Take 1 tablet (25 mg total) by mouth daily., Disp: 90 tablet, Rfl: 1 ?  tadalafil (CIALIS) 5 MG tablet, Take 5 mg by mouth as needed for erectile dysfunction., Disp: , Rfl:  ?  Turmeric Curcumin 500 MG CAPS, Take 1 capsule by mouth daily., Disp: , Rfl:  ?  zinc gluconate 50 MG tablet, Take by mouth., Disp: , Rfl:   ? ?Allergies  ?Allergen Reactions  ? Mucinex Dm [Dm-Guaifenesin Er]   ? ? ?Past Medical History:  ?Diagnosis Date  ? Hypertension   ?  Multiple vitamin deficiency   ? Obesity   ? OSA (obstructive sleep apnea)   ?  ? ?History reviewed. No pertinent surgical history. ? ?Family History  ?Problem Relation Age of Onset  ? Heart attack Mother 56  ? Heart attack Father 24  ? ? ?Social History  ? ?Tobacco Use  ? Smoking status: Every Day  ?  Types: Cigars  ? Smokeless tobacco: Never  ?Substance Use Topics  ? Alcohol use: Yes  ? Drug use: Never  ? ? ?ROS ? ? ?Objective:  ? ?Vitals: ?BP (!) 155/113 (BP Location: Right Arm)   Pulse 73   Temp 98.1 ?F (36.7 ?C) (Oral)   Resp 20   SpO2 96%  ? ?Physical Exam ?Constitutional:   ?   General: He is not in acute distress. ?   Appearance: Normal appearance. He is well-developed and normal weight. He is not ill-appearing, toxic-appearing or diaphoretic.  ?HENT:  ?   Head: Normocephalic and atraumatic.  ?   Right Ear: Tympanic membrane, ear canal and external ear normal. There is no impacted cerumen.  ?   Left Ear: Tympanic membrane, ear canal and external ear normal. There is no impacted cerumen.  ?   Nose: Congestion and rhinorrhea present.  ?   Mouth/Throat:  ?   Mouth: Mucous membranes are moist.  ?  Pharynx: No oropharyngeal exudate or posterior oropharyngeal erythema.  ?   Comments: Post-nasal drainage overlying pharynx.  ?Eyes:  ?   General: No scleral icterus.    ?   Right eye: No discharge.     ?   Left eye: No discharge.  ?   Extraocular Movements: Extraocular movements intact.  ?   Conjunctiva/sclera: Conjunctivae normal.  ?Cardiovascular:  ?   Rate and Rhythm: Normal rate and regular rhythm.  ?   Heart sounds: Normal heart sounds. No murmur heard. ?  No friction rub. No gallop.  ?Pulmonary:  ?   Effort: Pulmonary effort is normal. No respiratory distress.  ?   Breath sounds: Normal breath sounds. No stridor. No wheezing, rhonchi or rales.  ?Musculoskeletal:  ?   Cervical back: Normal range of motion and neck supple. No rigidity. No muscular tenderness.  ?Neurological:  ?   General: No focal deficit  present.  ?   Mental Status: He is alert and oriented to person, place, and time.  ?Psychiatric:     ?   Mood and Affect: Mood normal.     ?   Behavior: Behavior normal.     ?   Thought Content: Thought content normal.  ? ? ?Assessment and Plan :  ? ?PDMP not reviewed this encounter. ? ?1. Viral upper respiratory infection   ?2. Sinus congestion   ?3. Ear pain, bilateral   ?4. Acute cough   ?5. Smoker   ?6. Essential hypertension   ? ?Patient reported a history of bronchitis and therefore I offered him a chest x-ray.  Ultimately he declined.  Also declined COVID test. Suspect viral URI, viral syndrome. Physical exam findings reassuring and vital signs stable for discharge. Advised supportive care, offered symptomatic relief.  However, in the context of his smoking recommended on a prednisone course.  Start Xyzal daily.  Counseled patient on potential for adverse effects with medications prescribed/recommended today, ER and return-to-clinic precautions discussed, patient verbalized understanding.  ? ?  ?Wallis Bamberg, PA-C ?01/12/22 7341 ? ?

## 2022-01-12 NOTE — ED Triage Notes (Signed)
Pt reports cough, chest congestion, nasal congestion x 2 days. Mucinex DM gives some relief.  ?

## 2022-01-16 DIAGNOSIS — Z6841 Body Mass Index (BMI) 40.0 and over, adult: Secondary | ICD-10-CM | POA: Diagnosis not present

## 2022-01-16 DIAGNOSIS — J209 Acute bronchitis, unspecified: Secondary | ICD-10-CM | POA: Diagnosis not present

## 2022-01-16 DIAGNOSIS — J22 Unspecified acute lower respiratory infection: Secondary | ICD-10-CM | POA: Diagnosis not present

## 2022-02-12 IMAGING — CR DG TIBIA/FIBULA 2V*L*
4 series · 4 of 4 positions shown · non-contrast
Comparison: None.

CLINICAL DATA: Wound infection

EXAM:
LEFT TIBIA AND FIBULA - 2 VIEW

[tibia ap (1 of 2)]
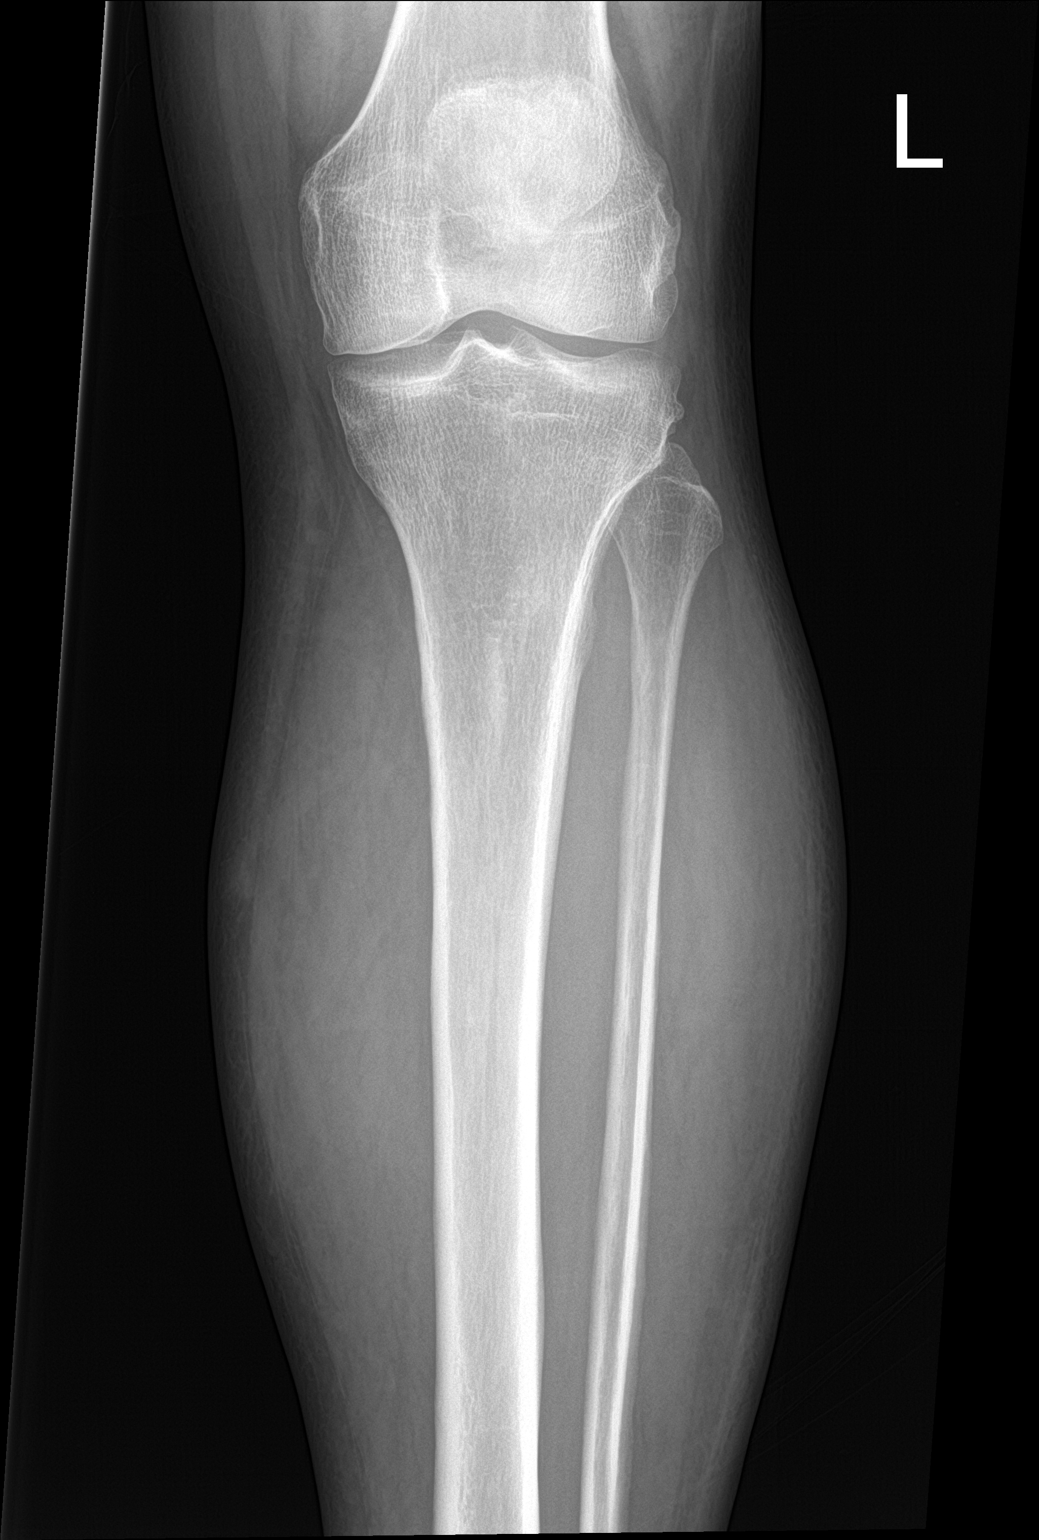

[tibia ap (2 of 2)]
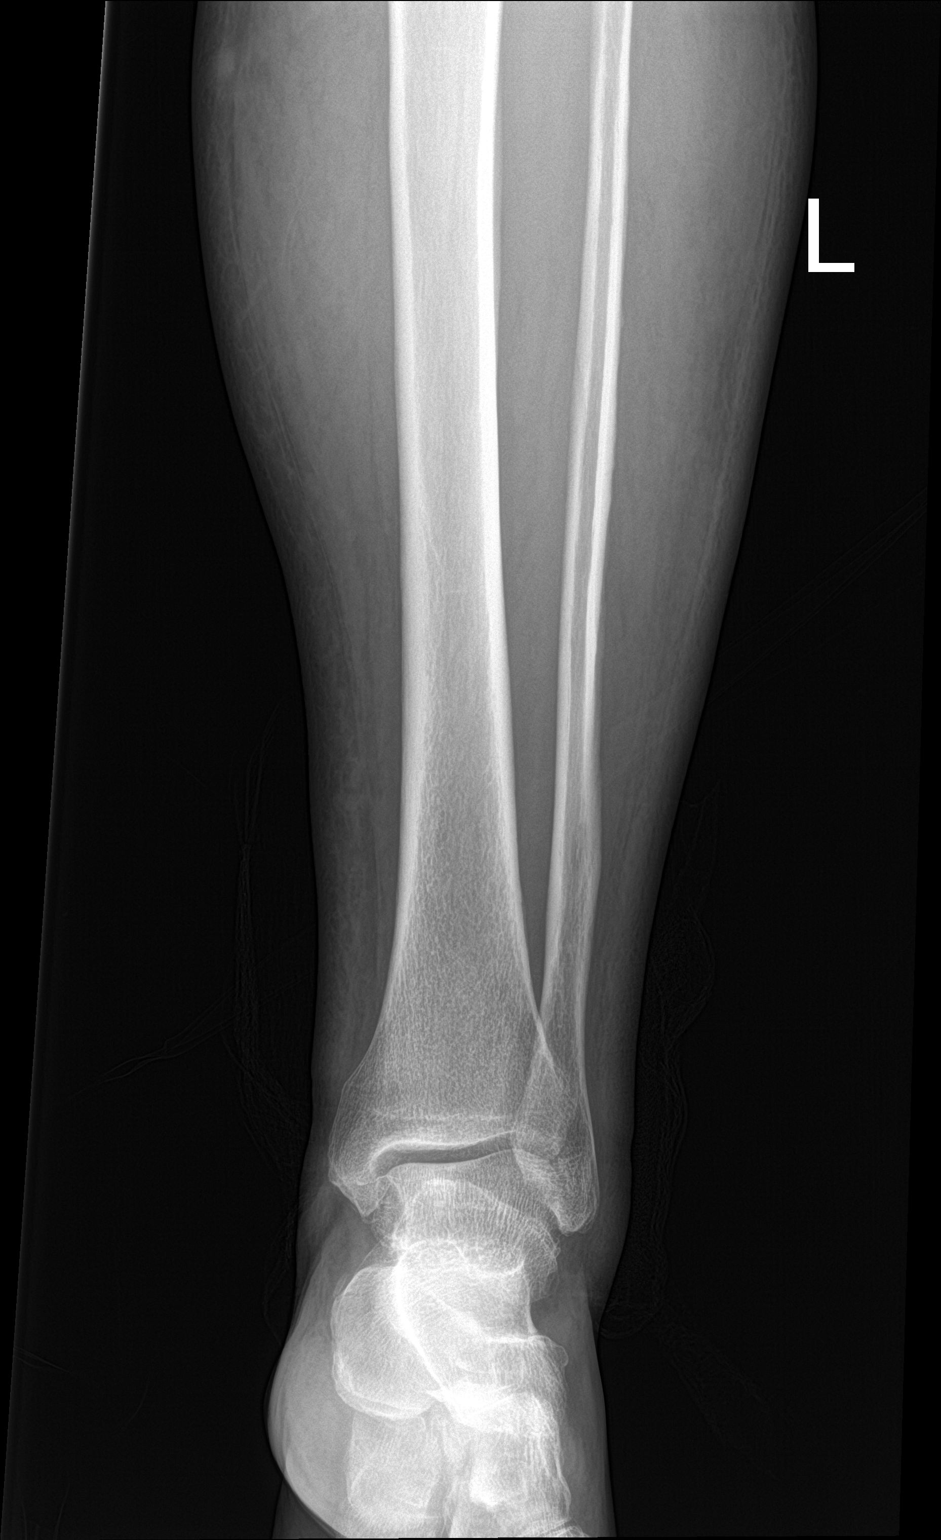

[tibia lat (1 of 2)]
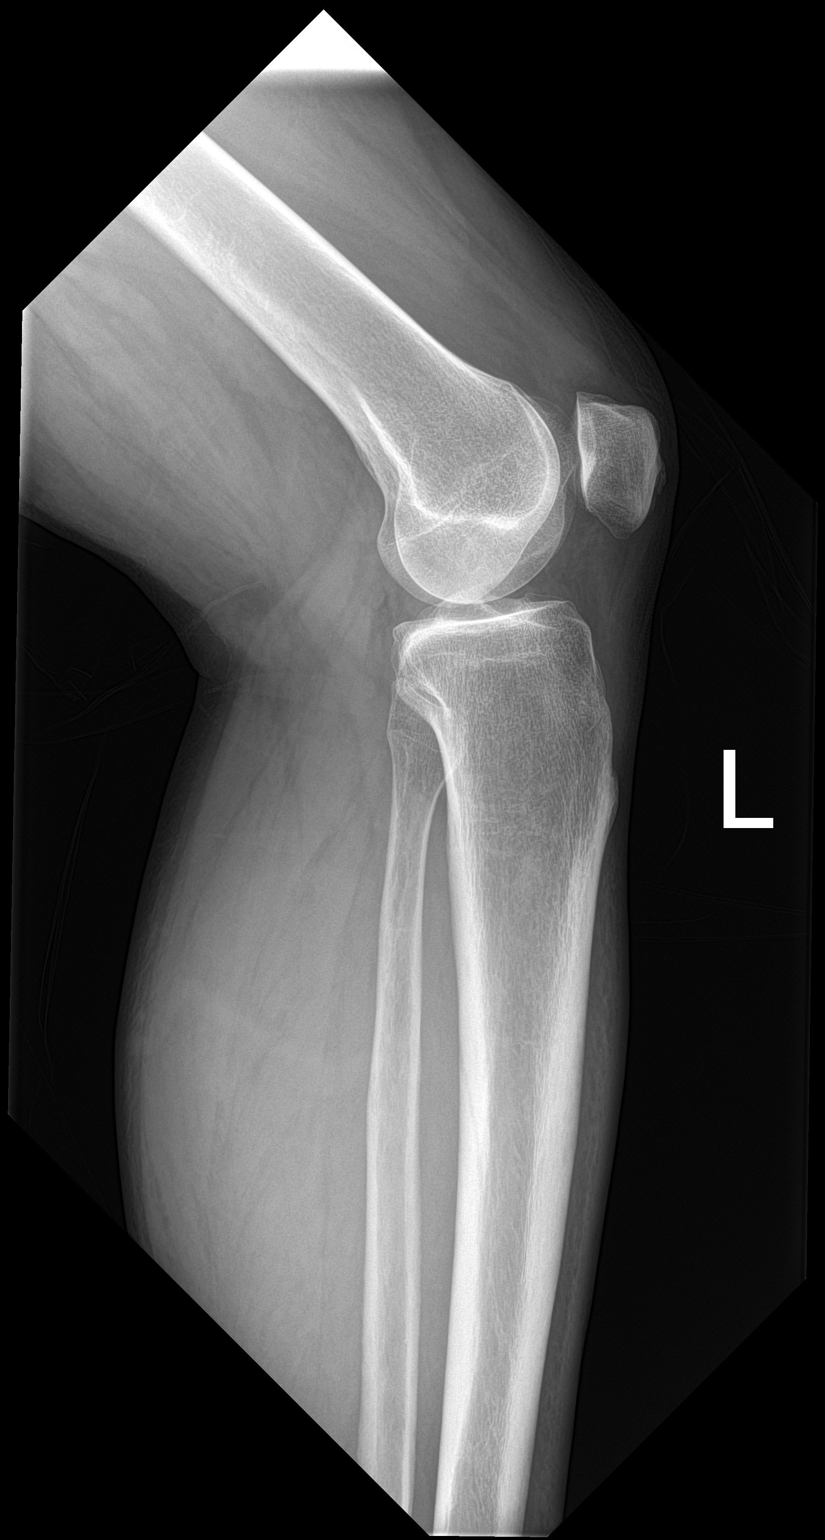

[tibia lat (2 of 2)]
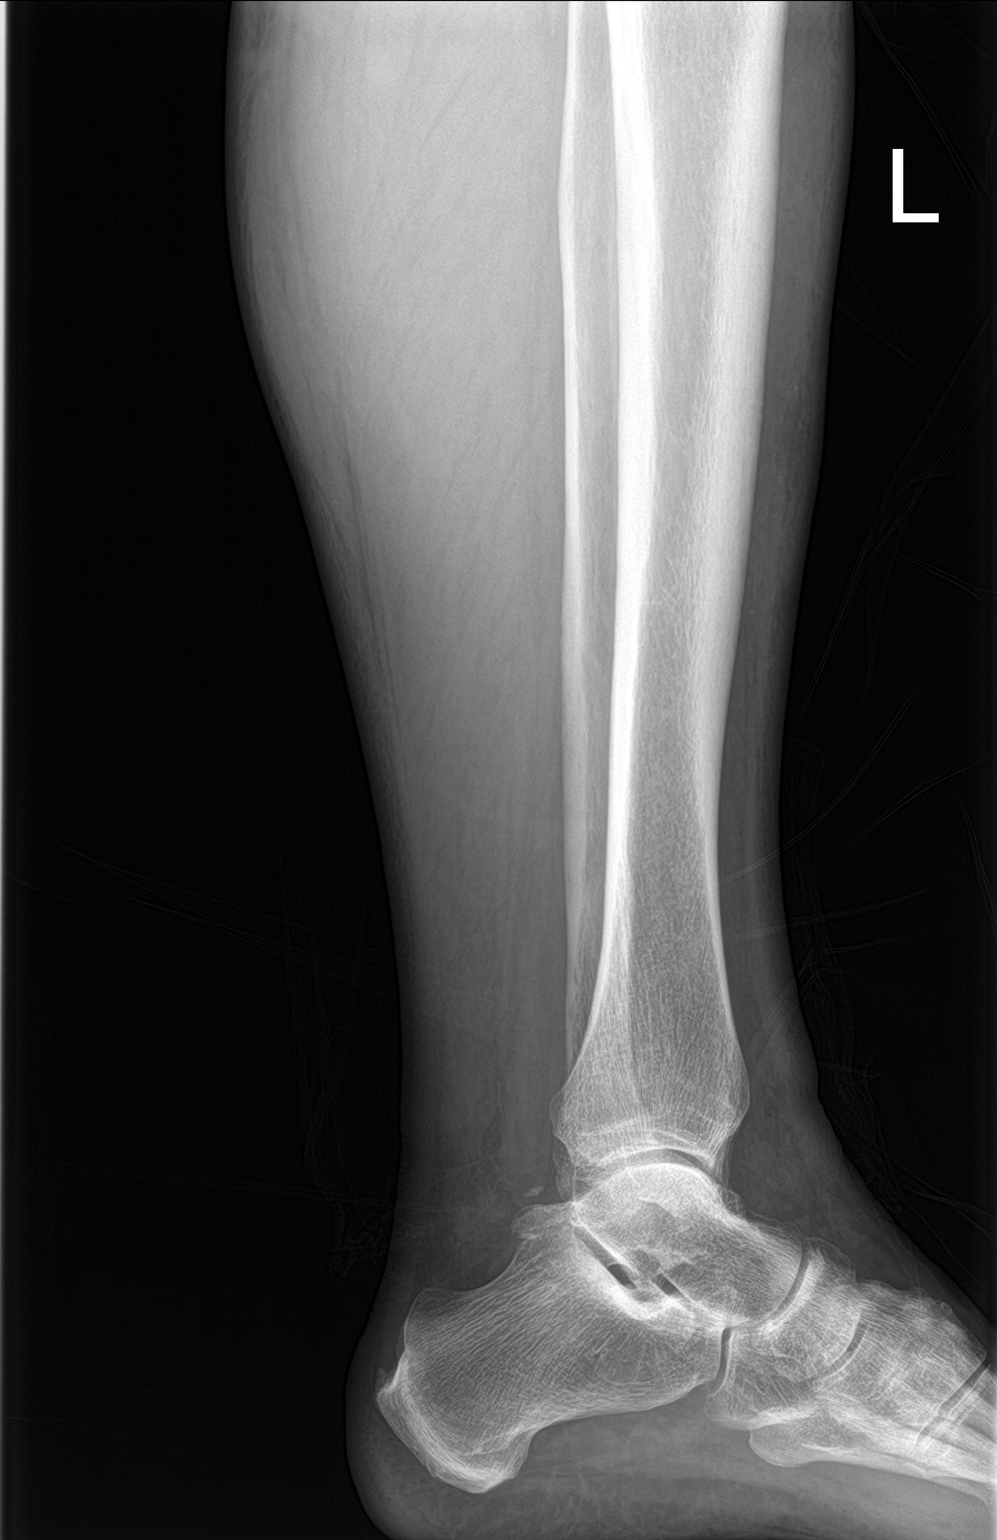

[4 of 4 positions shown; findings below may reference images not displayed]

FINDINGS: Included osseous structures are intact. There is no intrinsic
osseous lesion. No cortical erosion or periosteal reaction. No soft
tissue gas.
IMPRESSION: No acute osseous abnormality.

## 2022-11-09 DIAGNOSIS — I10 Essential (primary) hypertension: Secondary | ICD-10-CM | POA: Diagnosis not present

## 2022-11-09 DIAGNOSIS — Z6841 Body Mass Index (BMI) 40.0 and over, adult: Secondary | ICD-10-CM | POA: Diagnosis not present

## 2022-11-09 DIAGNOSIS — J019 Acute sinusitis, unspecified: Secondary | ICD-10-CM | POA: Diagnosis not present

## 2022-12-13 DIAGNOSIS — I10 Essential (primary) hypertension: Secondary | ICD-10-CM | POA: Diagnosis not present

## 2022-12-13 DIAGNOSIS — J189 Pneumonia, unspecified organism: Secondary | ICD-10-CM | POA: Diagnosis not present

## 2022-12-13 DIAGNOSIS — J90 Pleural effusion, not elsewhere classified: Secondary | ICD-10-CM | POA: Diagnosis not present

## 2022-12-13 DIAGNOSIS — Z6841 Body Mass Index (BMI) 40.0 and over, adult: Secondary | ICD-10-CM | POA: Diagnosis not present

## 2022-12-16 DIAGNOSIS — G4733 Obstructive sleep apnea (adult) (pediatric): Secondary | ICD-10-CM | POA: Diagnosis not present

## 2022-12-16 DIAGNOSIS — E538 Deficiency of other specified B group vitamins: Secondary | ICD-10-CM | POA: Diagnosis not present

## 2022-12-16 DIAGNOSIS — Z6841 Body Mass Index (BMI) 40.0 and over, adult: Secondary | ICD-10-CM | POA: Diagnosis not present

## 2022-12-16 DIAGNOSIS — Z0000 Encounter for general adult medical examination without abnormal findings: Secondary | ICD-10-CM | POA: Diagnosis not present

## 2022-12-16 DIAGNOSIS — J189 Pneumonia, unspecified organism: Secondary | ICD-10-CM | POA: Diagnosis not present

## 2022-12-16 DIAGNOSIS — I10 Essential (primary) hypertension: Secondary | ICD-10-CM | POA: Diagnosis not present

## 2022-12-16 DIAGNOSIS — Z0001 Encounter for general adult medical examination with abnormal findings: Secondary | ICD-10-CM | POA: Diagnosis not present

## 2022-12-16 DIAGNOSIS — Z9229 Personal history of other drug therapy: Secondary | ICD-10-CM | POA: Diagnosis not present

## 2022-12-16 DIAGNOSIS — E782 Mixed hyperlipidemia: Secondary | ICD-10-CM | POA: Diagnosis not present

## 2022-12-16 DIAGNOSIS — Z1331 Encounter for screening for depression: Secondary | ICD-10-CM | POA: Diagnosis not present

## 2022-12-16 DIAGNOSIS — E559 Vitamin D deficiency, unspecified: Secondary | ICD-10-CM | POA: Diagnosis not present

## 2022-12-16 DIAGNOSIS — J90 Pleural effusion, not elsewhere classified: Secondary | ICD-10-CM | POA: Diagnosis not present

## 2022-12-16 DIAGNOSIS — Z125 Encounter for screening for malignant neoplasm of prostate: Secondary | ICD-10-CM | POA: Diagnosis not present

## 2023-01-12 DIAGNOSIS — J189 Pneumonia, unspecified organism: Secondary | ICD-10-CM | POA: Diagnosis not present

## 2023-01-12 DIAGNOSIS — Z6841 Body Mass Index (BMI) 40.0 and over, adult: Secondary | ICD-10-CM | POA: Diagnosis not present

## 2023-01-12 DIAGNOSIS — G4733 Obstructive sleep apnea (adult) (pediatric): Secondary | ICD-10-CM | POA: Diagnosis not present

## 2023-01-23 DIAGNOSIS — G4733 Obstructive sleep apnea (adult) (pediatric): Secondary | ICD-10-CM | POA: Diagnosis not present

## 2023-02-12 DIAGNOSIS — E871 Hypo-osmolality and hyponatremia: Secondary | ICD-10-CM | POA: Diagnosis not present

## 2023-02-12 DIAGNOSIS — R7301 Impaired fasting glucose: Secondary | ICD-10-CM | POA: Diagnosis not present

## 2023-11-15 DIAGNOSIS — Z6841 Body Mass Index (BMI) 40.0 and over, adult: Secondary | ICD-10-CM | POA: Diagnosis not present

## 2023-11-15 DIAGNOSIS — J019 Acute sinusitis, unspecified: Secondary | ICD-10-CM | POA: Diagnosis not present

## 2023-11-15 DIAGNOSIS — R030 Elevated blood-pressure reading, without diagnosis of hypertension: Secondary | ICD-10-CM | POA: Diagnosis not present

## 2023-12-28 DIAGNOSIS — Z0001 Encounter for general adult medical examination with abnormal findings: Secondary | ICD-10-CM | POA: Diagnosis not present

## 2023-12-28 DIAGNOSIS — I10 Essential (primary) hypertension: Secondary | ICD-10-CM | POA: Diagnosis not present

## 2023-12-28 DIAGNOSIS — J189 Pneumonia, unspecified organism: Secondary | ICD-10-CM | POA: Diagnosis not present

## 2023-12-28 DIAGNOSIS — Z6841 Body Mass Index (BMI) 40.0 and over, adult: Secondary | ICD-10-CM | POA: Diagnosis not present

## 2023-12-28 DIAGNOSIS — G4733 Obstructive sleep apnea (adult) (pediatric): Secondary | ICD-10-CM | POA: Diagnosis not present

## 2023-12-28 DIAGNOSIS — E871 Hypo-osmolality and hyponatremia: Secondary | ICD-10-CM | POA: Diagnosis not present

## 2023-12-28 DIAGNOSIS — J90 Pleural effusion, not elsewhere classified: Secondary | ICD-10-CM | POA: Diagnosis not present

## 2023-12-28 DIAGNOSIS — R7301 Impaired fasting glucose: Secondary | ICD-10-CM | POA: Diagnosis not present

## 2023-12-28 DIAGNOSIS — Z1331 Encounter for screening for depression: Secondary | ICD-10-CM | POA: Diagnosis not present

## 2023-12-28 DIAGNOSIS — Z125 Encounter for screening for malignant neoplasm of prostate: Secondary | ICD-10-CM | POA: Diagnosis not present

## 2023-12-28 DIAGNOSIS — J22 Unspecified acute lower respiratory infection: Secondary | ICD-10-CM | POA: Diagnosis not present

## 2023-12-28 DIAGNOSIS — E538 Deficiency of other specified B group vitamins: Secondary | ICD-10-CM | POA: Diagnosis not present

## 2024-05-03 DIAGNOSIS — Z1211 Encounter for screening for malignant neoplasm of colon: Secondary | ICD-10-CM | POA: Diagnosis not present

## 2024-05-03 DIAGNOSIS — I10 Essential (primary) hypertension: Secondary | ICD-10-CM | POA: Diagnosis not present

## 2024-05-03 DIAGNOSIS — Z1331 Encounter for screening for depression: Secondary | ICD-10-CM | POA: Diagnosis not present

## 2024-05-03 DIAGNOSIS — G4733 Obstructive sleep apnea (adult) (pediatric): Secondary | ICD-10-CM | POA: Diagnosis not present

## 2024-05-03 DIAGNOSIS — Z9884 Bariatric surgery status: Secondary | ICD-10-CM | POA: Diagnosis not present

## 2024-05-03 NOTE — Progress Notes (Signed)
 Subjective   Patient ID:  Bruce Cruz is a 53 y.o. (DOB Jul 16, 1971) male    Patient presents with  . Medication Management  . Establish Care     HPI Bruce Cruz is here today to establish care and go over medications.  He has been a patient at [facility] for many years.  He recently had his physical in March and has plenty of medications for his refills.  He does have a history of high blood pressure he is currently taking olmesartan  hydrochlorothiazide 40/25, spironolactone  25 mg daily and carvedilol  twice a day.  He also takes Norvasc  10 mg daily.  He does not need any refills today.  He did bring his blood work he had mild the elevated triglycerides at 224 with mildly elevated LDL was told to make dietary changes and start fish oil.  He has a history of gastric bypass in 2017, he has lost approximately 100 pounds, was down to 150 but has gained some weight back.  He does have a history of sleep apnea, he wears his machine every night.  Does need a referral for colon cancer screening.   Reviewed and updated this visit by provider: None        Review of Systems  Constitutional: Negative.   Respiratory: Negative.    Cardiovascular: Negative.   Neurological: Negative.   Psychiatric/Behavioral: Negative.       Objective   Vitals:   05/03/24 1633 05/03/24 1659  BP: (!) 146/93 134/80  Patient Position: Sitting   Pulse: 66   Temp: 98.4 F (36.9 C)   TempSrc: Temporal   Resp: 18   Height: 5' 11 (1.803 m)   Weight: (!) 339 lb 9.6 oz (154 kg)   SpO2: 95%   BMI (Calculated): 47.4      Physical Exam Constitutional:      Appearance: Normal appearance. He is obese.  Cardiovascular:     Rate and Rhythm: Normal rate and regular rhythm.     Pulses: Normal pulses.     Heart sounds: Normal heart sounds.  Pulmonary:     Effort: Pulmonary effort is normal.     Breath sounds: Normal breath sounds.  Neurological:     General: No focal deficit present.     Mental  Status: He is alert and oriented to person, place, and time. Mental status is at baseline.  Psychiatric:        Mood and Affect: Mood normal.        Behavior: Behavior normal.        Thought Content: Thought content normal.        Judgment: Judgment normal.        Assessment and Plan  1. Essential hypertension (Primary) 2. History of gastric bypass 3. OSA (obstructive sleep apnea) 4. Colon cancer screening -     Ambulatory referral to Gastroenterology    Reviewed all recent labs the patient brought in medication list  Follow up in about 6 months (around 11/03/2024), or if symptoms worsen or fail to improve, for medication recheck, physical.     Care plan and follow-up as discussed or as needed if any worsening symptoms or change in condition. Pt expressed understanding. No barriers to meeting goals. After visit summary was given to the patient.   Patient's Medications  New Prescriptions   No medications on file  Previous Medications   AMLODIPINE  BESYLATE (NORVASC ) 10 MG TABLET    Take one tablet (10 mg dose) by mouth daily.  ASCORBIC ACID (VITAMIN C) TABLET    Take one tablet (500 mg dose) by mouth daily.   CARVEDILOL  (COREG ) 6.25 MG TABLET    Take one tablet (6.25 mg dose) by mouth 2 (two) times daily.   FERROUS SULFATE DRIED (FERROUS SULFATE IRON PO)    Take 25 mg by mouth daily.   KRILL OIL (OMEGA-3) 500 MG CAPS    Take 1 capsule by mouth daily.   MAGNESIUM OXIDE 400 TABS    Take 250 mg by mouth daily.   MISC NATURAL PRODUCTS (TURMERIC, CURCUMIN, PO)    Take 500 mg by mouth daily.   OLMESARTAN -HYDROCHLOROTHIAZIDE (BENICAR  HCT) 40-25 MG PER TABLET    Take one tablet by mouth daily.   PROBIOTIC, LACTOBACILLUS, CAPS    Take by mouth.   SPIRONOLACTONE  (ALDACTONE ) 25 MG TABLET    Take one tablet (25 mg dose) by mouth daily.   TADALAFIL (CIALIS) 5 MG TABLET    Take one tablet (5 mg dose) by mouth daily.   ZINC  GLUCONATE 50 MG TABLET    Take one tablet (50 mg dose) by mouth daily.   Modified Medications   No medications on file  Discontinued Medications   No medications on file      Risks, benefits, and alternatives of the medications and treatment plan prescribed today were discussed, and patient expressed understanding. Plan follow-up as discussed or as needed if any worsening symptoms or change in condition.   A yearly preventative health exam was recommended and current age based recommendations were discussed.  I have reviewed the information contained in this note and personally verified its accuracy.  MDM billing - I personally developed the plan of care based on documented medical decision making. Charmaine Heller, NP

## 2024-05-06 ENCOUNTER — Encounter (INDEPENDENT_AMBULATORY_CARE_PROVIDER_SITE_OTHER): Payer: Self-pay | Admitting: *Deleted

## 2024-05-24 ENCOUNTER — Telehealth: Payer: Self-pay

## 2024-05-24 NOTE — Telephone Encounter (Signed)
 Who is your primary care physician: Charmaine Heller  Reasons for the colonoscopy: screening  Have you had a colonoscopy before?  no  Do you have family history of colon cancer? no  Previous colonoscopy with polyps removed? no  Do you have a history colorectal cancer?   no  Are you diabetic? If yes, Type 1 or Type 2?    no  Do you have a prosthetic or mechanical heart valve? no  Do you have a pacemaker/defibrillator?   no  Have you had endocarditis/atrial fibrillation? no  Have you had joint replacement within the last 12 months?  no  Do you tend to be constipated or have to use laxatives? no  Do you have any history of drugs or alchohol?  no  Do you use supplemental oxygen?  no  Have you had a stroke or heart attack within the last 6 months? no  Do you take weight loss medication?  no  no   Do you take any blood-thinning medications such as: (aspirin, warfarin, Plavix, Aggrenox)  no  If yes we need the name, milligram, dosage and who is prescribing doctor  Current Outpatient Medications on File Prior to Visit  Medication Sig Dispense Refill   amLODipine  (NORVASC ) 10 MG tablet Take 1 tablet (10 mg total) by mouth daily. 90 tablet 1   carvedilol  (COREG ) 6.25 MG tablet Take 1 tablet (6.25 mg total) by mouth 2 (two) times daily. 180 tablet 3   olmesartan -hydrochlorothiazide (BENICAR  HCT) 40-25 MG tablet Take 1 tablet by mouth daily. 90 tablet 2   spironolactone  (ALDACTONE ) 25 MG tablet Take 1 tablet (25 mg total) by mouth daily. 90 tablet 1   tadalafil (CIALIS) 5 MG tablet Take 5 mg by mouth as needed for erectile dysfunction.     No current facility-administered medications on file prior to visit.    Allergies  Allergen Reactions   Mucinex Dm [Dm-Guaifenesin Er]      Pharmacy: [facility]   Primary Insurance Name: [employer] [id number]  Best number where you can be reached: [phone]

## 2024-05-31 DIAGNOSIS — G4733 Obstructive sleep apnea (adult) (pediatric): Secondary | ICD-10-CM | POA: Diagnosis not present

## 2024-06-24 NOTE — Telephone Encounter (Signed)
 OK to schedule.  ASA 3 due to BMI

## 2024-06-28 ENCOUNTER — Encounter: Payer: Self-pay | Admitting: *Deleted

## 2024-06-28 ENCOUNTER — Other Ambulatory Visit: Payer: Self-pay | Admitting: *Deleted

## 2024-06-28 ENCOUNTER — Encounter (INDEPENDENT_AMBULATORY_CARE_PROVIDER_SITE_OTHER): Payer: Self-pay | Admitting: *Deleted

## 2024-06-28 MED ORDER — PEG 3350-KCL-NA BICARB-NACL 420 G PO SOLR: 4000.0000 mL | Freq: Once | ORAL | 0 refills | Status: AC

## 2024-06-28 NOTE — Telephone Encounter (Signed)
 Referral completed, [facility] apt letter sent to PCP

## 2024-06-28 NOTE — Telephone Encounter (Signed)
 Pt is scheduled for Monday, 07/25/24 with Dr.Carver

## 2024-06-28 NOTE — Telephone Encounter (Signed)
 LMOVM to return call.

## 2024-06-28 NOTE — Telephone Encounter (Signed)
 Pt has been scheduled for 07/21/24 with Dr.Carver, instructions mailed and prep sent to pharmacy.

## 2024-06-29 ENCOUNTER — Encounter: Payer: Self-pay | Admitting: *Deleted

## 2024-07-01 DIAGNOSIS — G4733 Obstructive sleep apnea (adult) (pediatric): Secondary | ICD-10-CM | POA: Diagnosis not present

## 2024-07-04 NOTE — Telephone Encounter (Signed)
 Pt called and states he takes an iron supplement. Advised pt to hold iron x 7 days prior to procedure.

## 2024-07-20 NOTE — Patient Instructions (Signed)
 Bruce Cruz  07/20/2024     @PREFPERIOPPHARMACY @   Your procedure is scheduled on  07/25/2024.   Report to [facility] at  0600  A.M.   Call this number if you have problems the morning of surgery:  [phone]  If you experience any cold or flu symptoms such as cough, fever, chills, shortness of breath, etc. between now and your scheduled surgery, please notify us  at the above number.   Remember:  Follow the diet and prep instructions given to you by the office.    You may drink clear liquids until 0330 am on 07/25/2024.        Clear liquids allowed are:                    Water, Juice (No red color; non-citric and without pulp; diabetics please choose diet or no sugar options), Carbonated beverages (diabetics please choose diet or no sugar options), Clear Tea (No creamer, milk, or cream, including half & half and powdered creamer), Black Coffee Only (No creamer, milk or cream, including half & half and powdered creamer), and Clear Sports drink (No red color; diabetics please choose diet or no sugar options)    Take these medicines the morning of surgery with A SIP OF WATER                                         amlodipine , carvedilol .    Do not wear jewelry, make-up or nail polish, including gel polish,  artificial nails, or any other type of covering on natural nails (fingers and  toes).  Do not wear lotions, powders, or perfumes, or deodorant.  Do not shave 48 hours prior to surgery.  Men may shave face and neck.  Do not bring valuables to the hospital.  [facility] is not responsible for any belongings or valuables.  Contacts, dentures or bridgework may not be worn into surgery.  Leave your suitcase in the car.  After surgery it may be brought to your room.  For patients admitted to the hospital, discharge time will be determined by your treatment team.  Patients discharged the day of surgery will not be allowed to drive home and must have someone with them  for 24 hours.    Special instructions:   DO NOT smoke tobacco or vape for 24 hours before your procedure.  Please read over the following fact sheets that you were given. Anesthesia Post-op Instructions and Care and Recovery After Surgery      Colonoscopy, Adult, Care After The following information offers guidance on how to care for yourself after your procedure. Your health care provider may also give you more specific instructions. If you have problems or questions, contact your health care provider. What can I expect after the procedure? After the procedure, it is common to have: A small amount of blood in your stool for 24 hours after the procedure. Some gas. Mild cramping or bloating of your abdomen. Follow these instructions at home: Eating and drinking  Drink enough fluid to keep your urine pale yellow. Follow instructions from your health care provider about eating or drinking restrictions. Resume your normal diet as told by your health care provider. Avoid heavy or fried foods that are hard to digest. Activity Rest as told by your health care provider. Avoid sitting for a long  time without moving. Get up to take short walks every 1-2 hours. This is important to improve blood flow and breathing. Ask for help if you feel weak or unsteady. Return to your normal activities as told by your health care provider. Ask your health care provider what activities are safe for you. Managing cramping and bloating  Try walking around when you have cramps or feel bloated. If directed, apply heat to your abdomen as told by your health care provider. Use the heat source that your health care provider recommends, such as a moist heat pack or a heating pad. Place a towel between your skin and the heat source. Leave the heat on for 20-30 minutes. Remove the heat if your skin turns bright red. This is especially important if you are unable to feel pain, heat, or cold. You have a greater risk of  getting burned. General instructions If you were given a sedative during the procedure, it can affect you for several hours. Do not drive or operate machinery until your health care provider says that it is safe. For the first 24 hours after the procedure: Do not sign important documents. Do not drink alcohol. Do your regular daily activities at a slower pace than normal. Eat soft foods that are easy to digest. Take over-the-counter and prescription medicines only as told by your health care provider. Keep all follow-up visits. This is important. Contact a health care provider if: You have blood in your stool 2-3 days after the procedure. Get help right away if: You have more than a small spotting of blood in your stool. You have large blood clots in your stool. You have swelling of your abdomen. You have nausea or vomiting. You have a fever. You have increasing pain in your abdomen that is not relieved with medicine. These symptoms may be an emergency. Get help right away. Call 911. Do not wait to see if the symptoms will go away. Do not drive yourself to the hospital. Summary After the procedure, it is common to have a small amount of blood in your stool. You may also have mild cramping and bloating of your abdomen. If you were given a sedative during the procedure, it can affect you for several hours. Do not drive or operate machinery until your health care provider says that it is safe. Get help right away if you have a lot of blood in your stool, nausea or vomiting, a fever, or increased pain in your abdomen. This information is not intended to replace advice given to you by your health care provider. Make sure you discuss any questions you have with your health care provider. Document Revised: 11/25/2022 Document Reviewed: 06/05/2021 Elsevier Patient Education  2024 Elsevier Inc.General Anesthesia, Adult, Care After The following information offers guidance on how to care for  yourself after your procedure. Your health care provider may also give you more specific instructions. If you have problems or questions, contact your health care provider. What can I expect after the procedure? After the procedure, it is common for people to: Have pain or discomfort at the IV site. Have nausea or vomiting. Have a sore throat or hoarseness. Have trouble concentrating. Feel cold or chills. Feel weak, sleepy, or tired (fatigue). Have soreness and body aches. These can affect parts of the body that were not involved in surgery. Follow these instructions at home: For the time period you were told by your health care provider:  Rest. Do not participate in activities where you  could fall or become injured. Do not drive or use machinery. Do not drink alcohol. Do not take sleeping pills or medicines that cause drowsiness. Do not make important decisions or sign legal documents. Do not take care of children on your own. General instructions Drink enough fluid to keep your urine pale yellow. If you have sleep apnea, surgery and certain medicines can increase your risk for breathing problems. Follow instructions from your health care provider about wearing your sleep device: Anytime you are sleeping, including during daytime naps. While taking prescription pain medicines, sleeping medicines, or medicines that make you drowsy. Return to your normal activities as told by your health care provider. Ask your health care provider what activities are safe for you. Take over-the-counter and prescription medicines only as told by your health care provider. Do not use any products that contain nicotine or tobacco. These products include cigarettes, chewing tobacco, and vaping devices, such as e-cigarettes. These can delay incision healing after surgery. If you need help quitting, ask your health care provider. Contact a health care provider if: You have nausea or vomiting that does not get  better with medicine. You vomit every time you eat or drink. You have pain that does not get better with medicine. You cannot urinate or have bloody urine. You develop a skin rash. You have a fever. Get help right away if: You have trouble breathing. You have chest pain. You vomit blood. These symptoms may be an emergency. Get help right away. Call 911. Do not wait to see if the symptoms will go away. Do not drive yourself to the hospital. Summary After the procedure, it is common to have a sore throat, hoarseness, nausea, vomiting, or to feel weak, sleepy, or fatigue. For the time period you were told by your health care provider, do not drive or use machinery. Get help right away if you have difficulty breathing, have chest pain, or vomit blood. These symptoms may be an emergency. This information is not intended to replace advice given to you by your health care provider. Make sure you discuss any questions you have with your health care provider. Document Revised: 01/10/2022 Document Reviewed: 01/10/2022 Elsevier Patient Education  2024 [employer].

## 2024-07-21 ENCOUNTER — Encounter (HOSPITAL_COMMUNITY): Payer: Self-pay

## 2024-07-21 ENCOUNTER — Encounter (HOSPITAL_COMMUNITY)
Admission: RE | Admit: 2024-07-21 | Discharge: 2024-07-21 | Disposition: A | Discharge status: Home / Self Care | Source: Ambulatory Visit | Attending: Internal Medicine | Admitting: Internal Medicine

## 2024-07-21 VITALS — BP 131/92 | HR 65 | Resp 18 | Ht 71.0 in | Wt 293.2 lb

## 2024-07-21 DIAGNOSIS — I10 Essential (primary) hypertension: Secondary | ICD-10-CM | POA: Insufficient documentation

## 2024-07-21 DIAGNOSIS — Z79899 Other long term (current) drug therapy: Secondary | ICD-10-CM | POA: Insufficient documentation

## 2024-07-21 DIAGNOSIS — Z01818 Encounter for other preprocedural examination: Secondary | ICD-10-CM | POA: Insufficient documentation

## 2024-07-21 LAB — BASIC METABOLIC PANEL WITH GFR
Anion gap: 11 (ref 5–15)
BUN: 14 mg/dL (ref 6–20)
CO2: 23 mmol/L (ref 22–32)
Calcium: 8.9 mg/dL (ref 8.9–10.3)
Chloride: 99 mmol/L (ref 98–111)
Creatinine, Ser: 0.85 mg/dL (ref 0.61–1.24)
GFR, Estimated: >60 mL/min (ref >60)
Glucose, Bld: 100 mg/dL — ABNORMAL HIGH (ref 70–99)
Potassium: 3.6 mmol/L (ref 3.5–5.1)
Sodium: 133 mmol/L — ABNORMAL LOW (ref 135–145)

## 2024-07-25 ENCOUNTER — Ambulatory Visit (HOSPITAL_COMMUNITY): Payer: Self-pay | Admitting: Anesthesiology

## 2024-07-25 ENCOUNTER — Encounter (HOSPITAL_COMMUNITY)
Admission: RE | Disposition: A | Discharge status: Home / Self Care | Payer: Self-pay | Source: Home / Self Care | Attending: Internal Medicine

## 2024-07-25 ENCOUNTER — Ambulatory Visit (HOSPITAL_COMMUNITY)
Admission: RE | Admit: 2024-07-25 | Discharge: 2024-07-25 | Disposition: A | Discharge status: Home / Self Care | Attending: Internal Medicine | Admitting: Internal Medicine

## 2024-07-25 ENCOUNTER — Encounter (HOSPITAL_COMMUNITY): Payer: Self-pay | Admitting: Internal Medicine

## 2024-07-25 DIAGNOSIS — F17290 Nicotine dependence, other tobacco product, uncomplicated: Secondary | ICD-10-CM | POA: Diagnosis not present

## 2024-07-25 DIAGNOSIS — Z139 Encounter for screening, unspecified: Secondary | ICD-10-CM | POA: Diagnosis not present

## 2024-07-25 DIAGNOSIS — Z79899 Other long term (current) drug therapy: Secondary | ICD-10-CM | POA: Diagnosis not present

## 2024-07-25 DIAGNOSIS — G4733 Obstructive sleep apnea (adult) (pediatric): Secondary | ICD-10-CM | POA: Insufficient documentation

## 2024-07-25 DIAGNOSIS — I10 Essential (primary) hypertension: Secondary | ICD-10-CM | POA: Diagnosis not present

## 2024-07-25 DIAGNOSIS — Z9884 Bariatric surgery status: Secondary | ICD-10-CM | POA: Insufficient documentation

## 2024-07-25 DIAGNOSIS — Z1211 Encounter for screening for malignant neoplasm of colon: Secondary | ICD-10-CM

## 2024-07-25 DIAGNOSIS — D175 Benign lipomatous neoplasm of intra-abdominal organs: Secondary | ICD-10-CM

## 2024-07-25 DIAGNOSIS — K648 Other hemorrhoids: Secondary | ICD-10-CM | POA: Diagnosis not present

## 2024-07-25 DIAGNOSIS — G4730 Sleep apnea, unspecified: Secondary | ICD-10-CM | POA: Diagnosis not present

## 2024-07-25 DIAGNOSIS — E66813 Obesity, class 3: Secondary | ICD-10-CM | POA: Diagnosis not present

## 2024-07-25 DIAGNOSIS — Z6841 Body Mass Index (BMI) 40.0 and over, adult: Secondary | ICD-10-CM | POA: Diagnosis not present

## 2024-07-25 DIAGNOSIS — K635 Polyp of colon: Secondary | ICD-10-CM | POA: Diagnosis not present

## 2024-07-25 HISTORY — PX: COLONOSCOPY: SHX5424

## 2024-07-25 SURGERY — COLONOSCOPY
Anesthesia: General

## 2024-07-25 MED ORDER — PROPOFOL 500 MG/50ML IV EMUL
INTRAVENOUS | Status: DC | PRN
  Administered 2024-07-25 (×2): 50 mg via INTRAVENOUS
  Administered 2024-07-25: 150 ug/kg/min via INTRAVENOUS
  Administered 2024-07-25: 100 mg via INTRAVENOUS

## 2024-07-25 MED ORDER — LACTATED RINGERS IV SOLN: INTRAVENOUS | Status: DC

## 2024-07-25 MED ORDER — DEXMEDETOMIDINE HCL IN NACL 80 MCG/20ML IV SOLN
INTRAVENOUS | Status: DC | PRN
  Administered 2024-07-25: 8 ug via INTRAVENOUS

## 2024-07-25 MED ORDER — LACTATED RINGERS IV SOLN: INTRAVENOUS | Status: DC | PRN

## 2024-07-25 NOTE — Op Note (Signed)
 [facility] Patient Name: Bruce Cruz Procedure Date: 07/25/2024 7:05 AM MRN: [id number] Date of Birth: May 17, 1971 Attending MD: Carlin POUR. Cindie , [place], [id number] CSN: [id number] Age: 53 Admit Type: Outpatient Procedure:                Colonoscopy Indications:              Screening for colorectal malignant neoplasm Providers:                Carlin POUR. Cindie, DO, Tammy Vaught, RN, Bascom Blush Referring MD:              Medicines:                See the Anesthesia note for documentation of the                            administered medications Complications:            No immediate complications. Estimated Blood Loss:     Estimated blood loss was minimal. Procedure:                Pre-Anesthesia Assessment:                           - The anesthesia plan was to use monitored                            anesthesia care (MAC).                           After obtaining informed consent, the colonoscope                            was passed under direct vision. Throughout the                            procedure, the patient's blood pressure, pulse, and                            oxygen saturations were monitored continuously. The                            PCF-HQ190L ([id number]) Peds Colon was introduced                            through the anus and advanced to the the cecum,                            identified by appendiceal orifice and ileocecal                            valve. The colonoscopy was performed without                            difficulty. The patient tolerated the procedure  well. The quality of the bowel preparation was                            evaluated using the BBPS [facility] Bowel Preparation                            Scale) with scores of: Right Colon = 3, Transverse                            Colon = 3 and Left Colon = 3 (entire mucosa seen                            well with no residual  staining, small fragments of                            stool or opaque liquid). The total BBPS score                            equals 9. Scope In: 7:34:44 AM Scope Out: 7:50:43 AM Scope Withdrawal Time: 0 hours 10 minutes 12 seconds  Total Procedure Duration: 0 hours 15 minutes 59 seconds  Findings:      Non-bleeding internal hemorrhoids were found.      A 5 mm polyp was found in the sigmoid colon. The polyp was sessile. The       polyp was removed with a cold snare. Resection and retrieval were       complete.      There was a medium-sized lipoma, in the ascending colon.      The exam was otherwise without abnormality. Impression:               - Non-bleeding internal hemorrhoids.                           - One 5 mm polyp in the sigmoid colon, removed with                            a cold snare. Resected and retrieved.                           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                           - The examination was otherwise normal. Moderate Sedation:      Per Anesthesia Care Recommendation:           - Patient has a contact number available for                            emergencies. The signs and symptoms of potential                            delayed complications were discussed with the  patient. Return to normal activities tomorrow.                            Written discharge instructions were provided to the                            patient.                           - Resume previous diet.                           - Continue present medications.                           - Await pathology results.                           - Repeat colonoscopy in 7-10 years depending on                            pathology results for surveillance.                           - Return to GI clinic PRN. Procedure Code(s):        --- Professional ---                           [phone], Colonoscopy, flexible; with removal of                             tumor(s), polyp(s), or other lesion(s) by snare                            technique Diagnosis Code(s):        --- Professional ---                           Z12.11, Encounter for screening for malignant                            neoplasm of colon                           D12.5, Benign neoplasm of sigmoid colon                           D17.5, Benign lipomatous neoplasm of                            intra-abdominal organs                           K64.8, Other hemorrhoids CPT copyright 2022 American Medical Association. All rights reserved. The codes documented in this report are preliminary and upon coder review may  be revised to meet current compliance requirements. Carlin POUR. Cindie, DO Carlin POUR. Cindie, DO 07/25/2024 7:53:32 AM This report  has been signed electronically. Number of Addenda: 0

## 2024-07-25 NOTE — Anesthesia Postprocedure Evaluation (Signed)
 Anesthesia Post Note  Patient: Bruce Cruz  Procedure(s) Performed: COLONOSCOPY  Patient location during evaluation: Phase II Anesthesia Type: General Level of consciousness: awake and alert Pain management: pain level controlled Vital Signs Assessment: post-procedure vital signs reviewed and stable Respiratory status: spontaneous breathing, nonlabored ventilation and respiratory function stable Cardiovascular status: stable Anesthetic complications: no   There were no known notable events for this encounter.   Last Vitals:  Vitals:   07/25/24 0702 07/25/24 0758  BP: (!) 155/95 119/82  Pulse:  65  Resp: 12 16  Temp: 37 C 36.5 C  SpO2: 95% 98%    Last Pain:  Vitals:   07/25/24 0758  TempSrc: Oral  PainSc: 0-No pain                 Kaleth Koy L Eleftherios Dudenhoeffer

## 2024-07-25 NOTE — Discharge Instructions (Addendum)
  Colonoscopy Discharge Instructions  Read the instructions outlined below and refer to this sheet in the next few weeks. These discharge instructions provide you with general information on caring for yourself after you leave the hospital. Your doctor may also give you specific instructions. While your treatment has been planned according to the most current medical practices available, unavoidable complications occasionally occur.   ACTIVITY You may resume your regular activity, but move at a slower pace for the next 24 hours.  Take frequent rest periods for the next 24 hours.  Walking will help get rid of the air and reduce the bloated feeling in your belly (abdomen).  No driving for 24 hours (because of the medicine (anesthesia) used during the test).   Do not sign any important legal documents or operate any machinery for 24 hours (because of the anesthesia used during the test).  NUTRITION Drink plenty of fluids.  You may resume your normal diet as instructed by your doctor.  Begin with a light meal and progress to your normal diet. Heavy or fried foods are harder to digest and may make you feel sick to your stomach (nauseated).  Avoid alcoholic beverages for 24 hours or as instructed.  MEDICATIONS You may resume your normal medications unless your doctor tells you otherwise.  WHAT YOU CAN EXPECT TODAY Some feelings of bloating in the abdomen.  Passage of more gas than usual.  Spotting of blood in your stool or on the toilet paper.  IF YOU HAD POLYPS REMOVED DURING THE COLONOSCOPY: No aspirin products for 7 days or as instructed.  No alcohol for 7 days or as instructed.  Eat a soft diet for the next 24 hours.  FINDING OUT THE RESULTS OF YOUR TEST Not all test results are available during your visit. If your test results are not back during the visit, make an appointment with your caregiver to find out the results. Do not assume everything is normal if you have not heard from your  caregiver or the medical facility. It is important for you to follow up on all of your test results.  SEEK IMMEDIATE MEDICAL ATTENTION IF: You have more than a spotting of blood in your stool.  Your belly is swollen (abdominal distention).  You are nauseated or vomiting.  You have a temperature over 101.  You have abdominal pain or discomfort that is severe or gets worse throughout the day.   Your colonoscopy revealed 1 polyp(s) which I removed successfully. Await pathology results, my office will contact you. I recommend repeating colonoscopy in 7-10 years for surveillance purposes depending on pathology results. Otherwise follow up with GI as needed.    I hope you have a great rest of your week!  Charles K. Carver, D.O. Gastroenterology and Hepatology [facility]  

## 2024-07-25 NOTE — H&P (Signed)
 Primary Care Physician:  Suanne Pfeiffer, NP Primary Gastroenterologist:  Dr. Cindie  Pre-Procedure History & Physical: HPI:  Bruce Cruz is a 53 y.o. male is here for first ever colonoscopy for colon cancer screening purposes.  Patient denies any family history of colorectal cancer.   Past Medical History:  Diagnosis Date   Hypertension    Multiple vitamin deficiency    Obesity    OSA (obstructive sleep apnea)     Past Surgical History:  Procedure Laterality Date   LAPAROSCOPIC GASTRIC BYPASS  2017    Prior to Admission medications   Medication Sig Start Date End Date Taking? Authorizing Provider  amLODipine  (NORVASC ) 10 MG tablet Take 1 tablet (10 mg total) by mouth daily. 11/01/21  Yes Hobart Powell BRAVO, MD  ascorbic acid (VITAMIN C) 500 MG tablet Take 500 mg by mouth daily.   Yes [provider]  carvedilol  (COREG ) 6.25 MG tablet Take 1 tablet (6.25 mg total) by mouth 2 (two) times daily. 12/17/20  Yes Hobart Powell BRAVO, MD  ferrous fumarate-b12-vitamic C-folic acid  (TRINSICON / FOLTRIN) capsule Take 1 capsule by mouth.   Yes [provider]  Anselm Oil 500 MG CAPS Take 500 mg by mouth daily.   Yes [provider]  LACTOBACILLUS PROBIOTIC PO Take by mouth.   Yes [provider]  magnesium oxide (MAG-OX) 400 (240 Mg) MG tablet Take 250 mg by mouth daily.   Yes [provider]  olmesartan -hydrochlorothiazide (BENICAR  HCT) 40-25 MG tablet Take 1 tablet by mouth daily. 04/02/21  Yes Hobart Powell BRAVO, MD  spironolactone  (ALDACTONE ) 25 MG tablet Take 1 tablet (25 mg total) by mouth daily. 10/14/21  Yes Hobart Powell BRAVO, MD  Turmeric (QC TUMERIC COMPLEX) 500 MG CAPS Take 500 mg by mouth daily.   Yes [provider]  ZINC  OXIDE PO Take 50 mg by mouth daily.   Yes [provider]  tadalafil (CIALIS) 5 MG tablet Take 5 mg by mouth as needed for erectile dysfunction.    [provider]    Allergies as  of 06/28/2024 - Review Complete 05/24/2024  Allergen Reaction Noted   Mucinex dm [dm-guaifenesin er]  01/12/2022    Family History  Problem Relation Age of Onset   Heart attack Mother 68   Heart attack Father 75    Social History   Socioeconomic History   Marital status: Single    Spouse name: Not on file   Number of children: Not on file   Years of education: Not on file   Highest education level: Not on file  Occupational History   Not on file  Tobacco Use   Smoking status: Every Day    Types: Cigars   Smokeless tobacco: Never  Substance and Sexual Activity   Alcohol use: Yes   Drug use: Never   Sexual activity: Yes  Other Topics Concern   Not on file  Social History Narrative   Not on file   Social Drivers of Health   Financial Resource Strain: Low Risk  (05/03/2024)   Received [facility]   Overall Financial Resource Strain (CARDIA)    Difficulty of Paying Living Expenses: Not hard at all  Food Insecurity: No Food Insecurity (05/03/2024)   Received from [facility]   Hunger Vital Sign    Within the past 12 months, you worried that your food would run out before you got the money to buy more.: Never true    Within the past 12 months,  the food you bought just didn't last and you didn't have money to get more.: Never true  Transportation Needs: No Transportation Needs (05/03/2024)   Received from [facility]   PRAPARE - Transportation    Lack of Transportation (Medical): No    Lack of Transportation (Non-Medical): No  Physical Activity: Not on file  Stress: Not on file  Social Connections: Not on file  Intimate Partner Violence: Not on file    Review of Systems: See HPI, otherwise negative ROS  Physical Exam: Vital signs in last 24 hours: Temp:  [98.6 F (37 C)] 98.6 F (37 C) (09/29 0702) Resp:  [12] 12 (09/29 0702) BP: (155)/(95) 155/95 (09/29 0702) SpO2:  [95 %] 95 % (09/29 0702)   General:   Alert,  Well-developed, well-nourished, pleasant  and cooperative in NAD Head:  Normocephalic and atraumatic. Eyes:  Sclera clear, no icterus.   Conjunctiva pink. Ears:  Normal auditory acuity. Nose:  No deformity, discharge,  or lesions. Msk:  Symmetrical without gross deformities. Normal posture. Extremities:  Without clubbing or edema. Neurologic:  Alert and  oriented x4;  grossly normal neurologically. Skin:  Intact without significant lesions or rashes. Psych:  Alert and cooperative. Normal mood and affect.  Impression/Plan: Bruce Cruz is here for a colonoscopy to be performed for colon cancer screening purposes.  The risks of the procedure including infection, bleed, or perforation as well as benefits, limitations, alternatives and imponderables have been reviewed with the patient. Questions have been answered. All parties agreeable.

## 2024-07-25 NOTE — Anesthesia Preprocedure Evaluation (Signed)
 Anesthesia Evaluation  Patient identified by MRN, date of birth, ID band Patient awake    Reviewed: Allergy & Precautions, H&P , NPO status , Patient's Chart, lab work & pertinent test results, reviewed documented beta blocker date and time   Airway Mallampati: III  TM Distance: >3 FB Neck ROM: full    Dental no notable dental hx. (+) Dental Advisory Given, Teeth Intact   Pulmonary sleep apnea , Current Smoker   Pulmonary exam normal breath sounds clear to auscultation       Cardiovascular Exercise Tolerance: Good hypertension, Normal cardiovascular exam Rhythm:regular Rate:Normal     Neuro/Psych negative neurological ROS  negative psych ROS   GI/Hepatic negative GI ROS, Neg liver ROS,,,  Endo/Other    Class 3 obesity  Renal/GU negative Renal ROS  negative genitourinary   Musculoskeletal   Abdominal   Peds  Hematology negative hematology ROS (+)   Anesthesia Other Findings   Reproductive/Obstetrics negative OB ROS                              Anesthesia Physical Anesthesia Plan  ASA: 3  Anesthesia Plan: General   Post-op Pain Management: Minimal or no pain anticipated   Induction: Intravenous  PONV Risk Score and Plan: Propofol infusion  Airway Management Planned: Natural Airway and Nasal Cannula  Additional Equipment: None  Intra-op Plan:   Post-operative Plan:   Informed Consent: I have reviewed the patients History and Physical, chart, labs and discussed the procedure including the risks, benefits and alternatives for the proposed anesthesia with the patient or authorized representative who has indicated his/her understanding and acceptance.     Dental Advisory Given  Plan Discussed with: CRNA  Anesthesia Plan Comments:          Anesthesia Quick Evaluation

## 2024-07-25 NOTE — Transfer of Care (Signed)
 Immediate Anesthesia Transfer of Care Note  Patient: Bruce Cruz  Procedure(s) Performed: COLONOSCOPY  Patient Location: Short Stay  Anesthesia Type:General  Level of Consciousness: awake, alert , and oriented  Airway & Oxygen Therapy: Patient Spontanous Breathing  Post-op Assessment: Report given to RN and Post -op Vital signs reviewed and stable  Post vital signs: Reviewed and stable  Last Vitals:  Vitals Value Taken Time  BP 103/55   Temp    Pulse 65   Resp 22   SpO2 97%     Last Pain:  Vitals:   07/25/24 0728  PainSc: 0-No pain         Complications: No notable events documented.

## 2024-07-26 ENCOUNTER — Encounter (HOSPITAL_COMMUNITY): Payer: Self-pay | Admitting: Internal Medicine

## 2024-07-26 LAB — SURGICAL PATHOLOGY

## 2024-08-30 ENCOUNTER — Ambulatory Visit: Payer: Self-pay | Admitting: Internal Medicine

## 2024-08-31 NOTE — Progress Notes (Signed)
 done

## 2024-09-04 ENCOUNTER — Ambulatory Visit: Payer: Self-pay | Admitting: Internal Medicine

## 2024-09-07 NOTE — Progress Notes (Signed)
 done

## 2024-09-30 DIAGNOSIS — G4733 Obstructive sleep apnea (adult) (pediatric): Secondary | ICD-10-CM | POA: Diagnosis not present

## 2024-10-22 DIAGNOSIS — R059 Cough, unspecified: Secondary | ICD-10-CM | POA: Diagnosis not present

## 2024-10-22 DIAGNOSIS — R0689 Other abnormalities of breathing: Secondary | ICD-10-CM | POA: Diagnosis not present

## 2024-10-22 DIAGNOSIS — R0981 Nasal congestion: Secondary | ICD-10-CM | POA: Diagnosis not present

## 2024-10-22 DIAGNOSIS — J189 Pneumonia, unspecified organism: Secondary | ICD-10-CM | POA: Diagnosis not present

## 2024-10-24 DIAGNOSIS — G4733 Obstructive sleep apnea (adult) (pediatric): Secondary | ICD-10-CM | POA: Diagnosis not present
